# Patient Record
Sex: Female | Born: 1985 | Race: Black or African American | Hispanic: No | Marital: Single | Smoking: Current every day smoker
Health system: Southern US, Community
[De-identification: ages and names within clinical notes are randomized; demographics above are authoritative.]

## PROBLEM LIST (undated history)

## (undated) DIAGNOSIS — I10 Essential (primary) hypertension: Secondary | ICD-10-CM

## (undated) HISTORY — PX: CHOLECYSTECTOMY: SHX55

## (undated) HISTORY — PX: APPENDECTOMY: SHX54

## (undated) HISTORY — PX: OTHER SURGICAL HISTORY: SHX169

---
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
## 2006-12-29 ENCOUNTER — Emergency Department (HOSPITAL_COMMUNITY): Admission: EM | Admit: 2006-12-29 | Discharge: 2006-12-29 | Payer: Self-pay | Admitting: Family Medicine

## 2007-04-30 ENCOUNTER — Ambulatory Visit (HOSPITAL_COMMUNITY): Admission: RE | Admit: 2007-04-30 | Discharge: 2007-04-30 | Payer: Self-pay | Admitting: Family Medicine

## 2007-04-30 ENCOUNTER — Ambulatory Visit: Payer: Self-pay | Admitting: Family Medicine

## 2007-05-01 ENCOUNTER — Ambulatory Visit: Payer: Self-pay | Admitting: *Deleted

## 2007-06-03 ENCOUNTER — Emergency Department (HOSPITAL_COMMUNITY): Admission: EM | Admit: 2007-06-03 | Discharge: 2007-06-03 | Payer: Self-pay | Admitting: *Deleted

## 2007-06-22 ENCOUNTER — Ambulatory Visit: Payer: Self-pay | Admitting: Family Medicine

## 2007-07-21 ENCOUNTER — Inpatient Hospital Stay (HOSPITAL_COMMUNITY): Admission: AD | Admit: 2007-07-21 | Discharge: 2007-07-21 | Payer: Self-pay | Admitting: Family Medicine

## 2007-07-26 ENCOUNTER — Ambulatory Visit: Payer: Self-pay | Admitting: *Deleted

## 2007-08-08 ENCOUNTER — Ambulatory Visit (HOSPITAL_COMMUNITY): Admission: RE | Admit: 2007-08-08 | Discharge: 2007-08-08 | Payer: Self-pay | Admitting: Obstetrics & Gynecology

## 2007-08-13 ENCOUNTER — Ambulatory Visit (HOSPITAL_COMMUNITY): Admission: RE | Admit: 2007-08-13 | Discharge: 2007-08-13 | Payer: Self-pay | Admitting: Obstetrics & Gynecology

## 2007-08-20 ENCOUNTER — Ambulatory Visit: Payer: Self-pay | Admitting: Obstetrics & Gynecology

## 2007-09-04 ENCOUNTER — Ambulatory Visit (HOSPITAL_COMMUNITY): Admission: RE | Admit: 2007-09-04 | Discharge: 2007-09-04 | Payer: Self-pay | Admitting: Obstetrics & Gynecology

## 2007-09-10 ENCOUNTER — Ambulatory Visit: Payer: Self-pay | Admitting: Obstetrics & Gynecology

## 2007-09-17 ENCOUNTER — Ambulatory Visit (HOSPITAL_COMMUNITY): Admission: RE | Admit: 2007-09-17 | Discharge: 2007-09-17 | Payer: Self-pay | Admitting: Obstetrics & Gynecology

## 2007-10-01 ENCOUNTER — Ambulatory Visit: Payer: Self-pay | Admitting: Obstetrics & Gynecology

## 2007-10-29 ENCOUNTER — Ambulatory Visit: Payer: Self-pay | Admitting: Obstetrics & Gynecology

## 2007-10-29 ENCOUNTER — Ambulatory Visit (HOSPITAL_COMMUNITY): Admission: RE | Admit: 2007-10-29 | Discharge: 2007-10-29 | Payer: Self-pay | Admitting: Obstetrics & Gynecology

## 2007-11-19 ENCOUNTER — Ambulatory Visit: Payer: Self-pay | Admitting: Obstetrics & Gynecology

## 2007-11-26 ENCOUNTER — Ambulatory Visit (HOSPITAL_COMMUNITY): Admission: RE | Admit: 2007-11-26 | Discharge: 2007-11-26 | Payer: Self-pay | Admitting: Obstetrics & Gynecology

## 2007-11-29 ENCOUNTER — Ambulatory Visit: Payer: Self-pay | Admitting: Family Medicine

## 2007-12-13 ENCOUNTER — Telehealth (INDEPENDENT_AMBULATORY_CARE_PROVIDER_SITE_OTHER): Payer: Self-pay | Admitting: *Deleted

## 2007-12-13 ENCOUNTER — Ambulatory Visit: Payer: Self-pay | Admitting: Family Medicine

## 2007-12-15 ENCOUNTER — Inpatient Hospital Stay (HOSPITAL_COMMUNITY): Admission: AD | Admit: 2007-12-15 | Discharge: 2007-12-15 | Payer: Self-pay | Admitting: Family Medicine

## 2007-12-15 ENCOUNTER — Ambulatory Visit: Payer: Self-pay | Admitting: Advanced Practice Midwife

## 2007-12-24 ENCOUNTER — Ambulatory Visit (HOSPITAL_COMMUNITY): Admission: RE | Admit: 2007-12-24 | Discharge: 2007-12-24 | Payer: Self-pay | Admitting: Obstetrics & Gynecology

## 2008-01-03 ENCOUNTER — Ambulatory Visit: Payer: Self-pay | Admitting: Family Medicine

## 2008-01-17 ENCOUNTER — Ambulatory Visit: Payer: Self-pay | Admitting: Family Medicine

## 2008-01-17 ENCOUNTER — Other Ambulatory Visit: Payer: Self-pay | Admitting: Family Medicine

## 2008-01-17 ENCOUNTER — Ambulatory Visit (HOSPITAL_COMMUNITY): Admission: RE | Admit: 2008-01-17 | Discharge: 2008-01-17 | Payer: Self-pay | Admitting: Obstetrics & Gynecology

## 2008-01-31 ENCOUNTER — Ambulatory Visit: Payer: Self-pay | Admitting: Obstetrics & Gynecology

## 2008-02-07 ENCOUNTER — Ambulatory Visit: Payer: Self-pay | Admitting: Obstetrics & Gynecology

## 2008-02-07 ENCOUNTER — Ambulatory Visit (HOSPITAL_COMMUNITY): Admission: RE | Admit: 2008-02-07 | Discharge: 2008-02-07 | Payer: Self-pay | Admitting: Obstetrics & Gynecology

## 2008-02-13 ENCOUNTER — Inpatient Hospital Stay (HOSPITAL_COMMUNITY): Admission: AD | Admit: 2008-02-13 | Discharge: 2008-02-15 | Payer: Self-pay | Admitting: Obstetrics & Gynecology

## 2008-02-13 ENCOUNTER — Ambulatory Visit: Payer: Self-pay | Admitting: Obstetrics and Gynecology

## 2008-03-16 ENCOUNTER — Emergency Department (HOSPITAL_COMMUNITY): Admission: EM | Admit: 2008-03-16 | Discharge: 2008-03-16 | Payer: Self-pay | Admitting: Emergency Medicine

## 2008-03-18 ENCOUNTER — Inpatient Hospital Stay (HOSPITAL_COMMUNITY): Admission: EM | Admit: 2008-03-18 | Discharge: 2008-03-19 | Payer: Self-pay | Admitting: Emergency Medicine

## 2008-03-18 ENCOUNTER — Encounter (INDEPENDENT_AMBULATORY_CARE_PROVIDER_SITE_OTHER): Payer: Self-pay | Admitting: General Surgery

## 2008-04-12 IMAGING — US US OB FOLLOW-UP
1 series · 14 of 28 positions shown · non-contrast
Comparison: none

OBSTETRICAL ULTRASOUND:
 This ultrasound was performed in The [HOSPITAL], and the [facility] report will be stored to [REDACTED] PACS.

[Series 1: us ob follow-up · 14 of 72 slices shown]
[im 3/72]
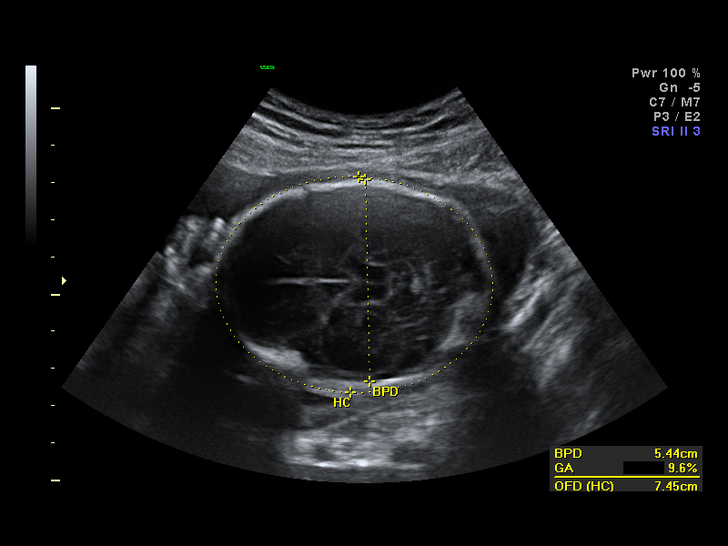
[im 8/72]
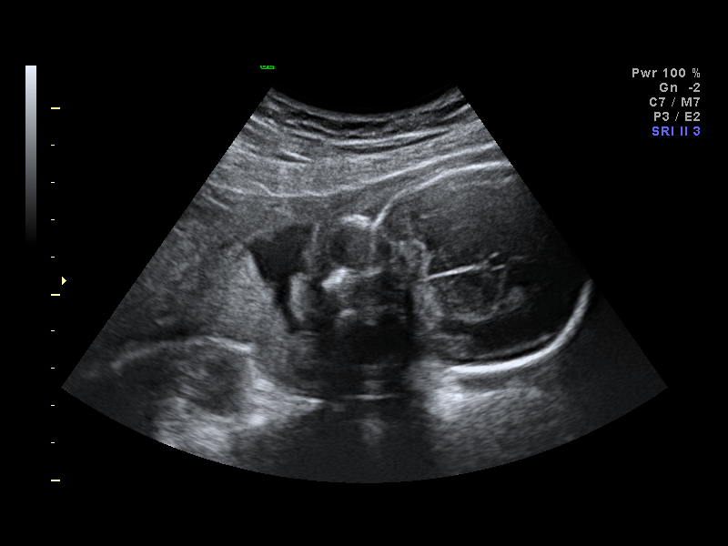
[im 14/72]
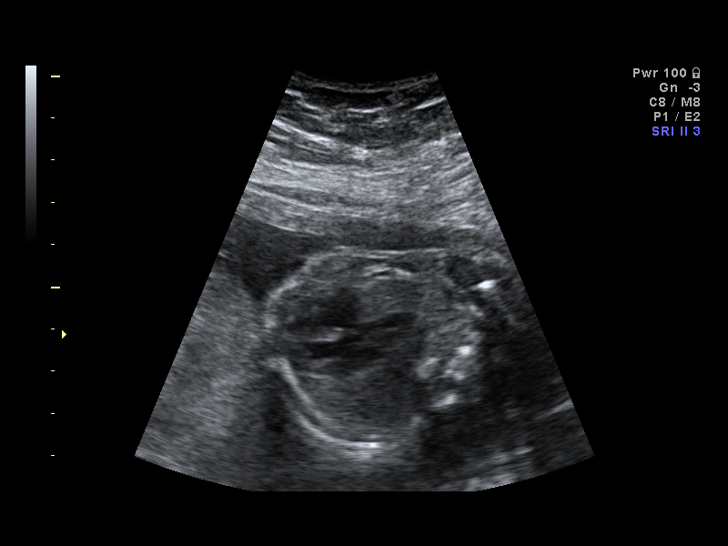
[im 19/72]
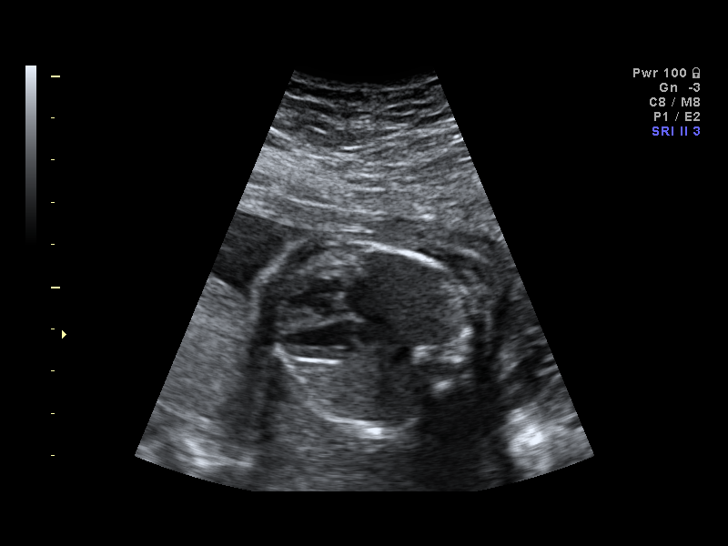
[im 24/72]
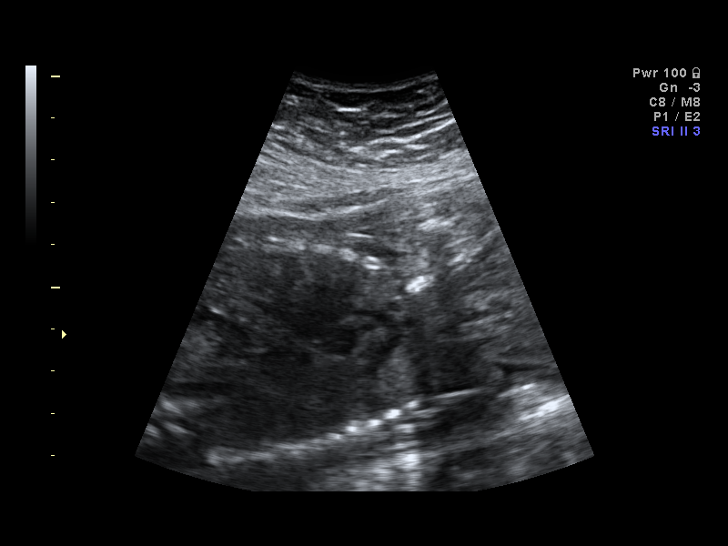
[im 29/72]
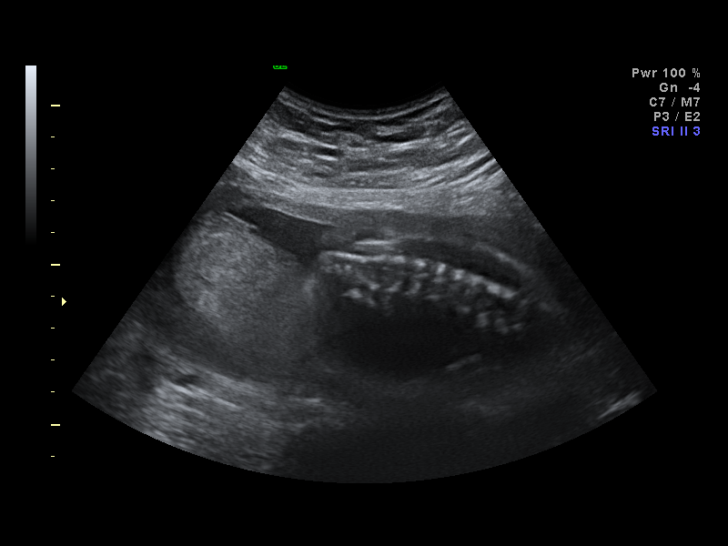
[im 35/72]
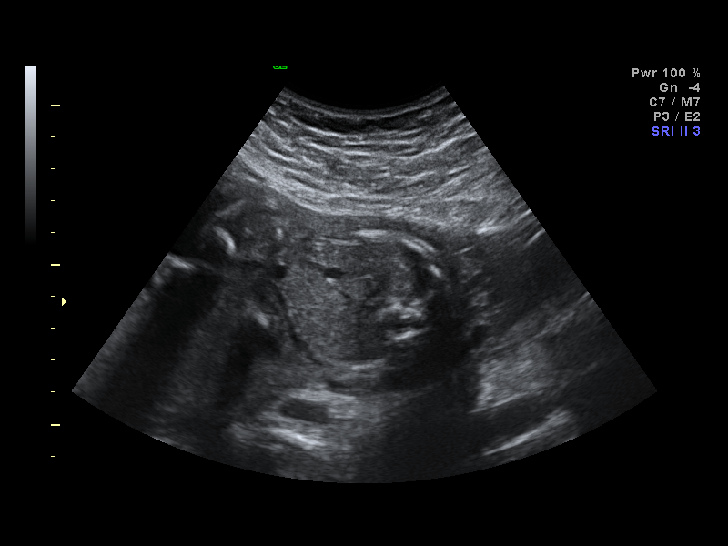
[im 40/72]
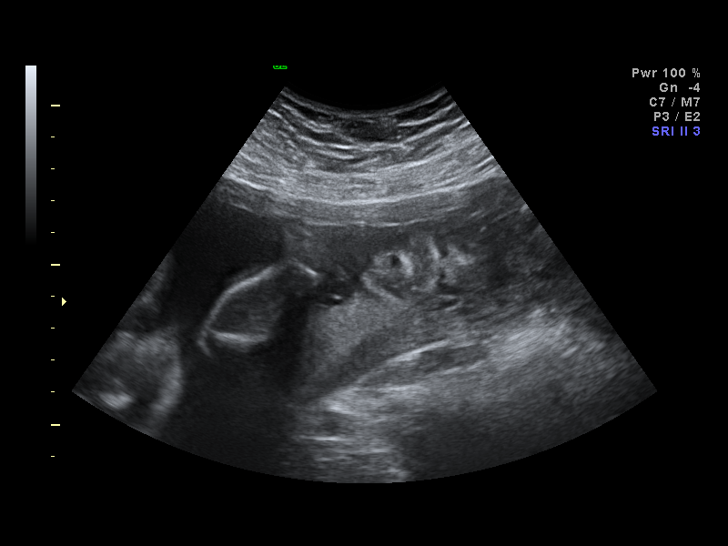
[im 45/72]
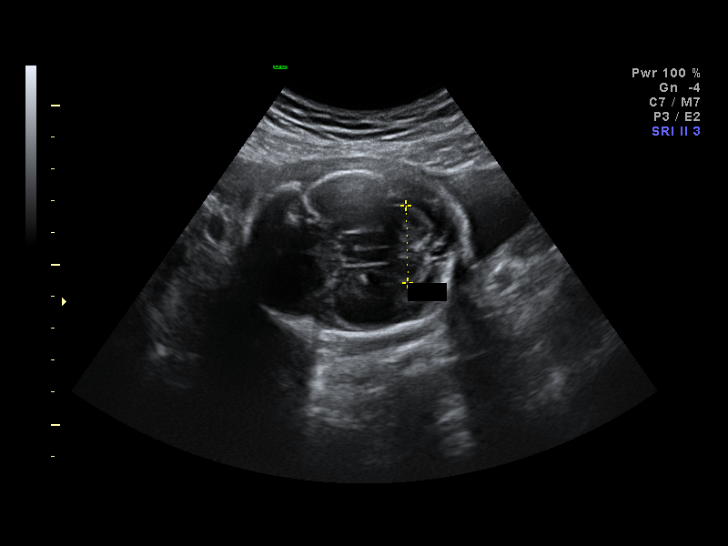
[im 50/72]
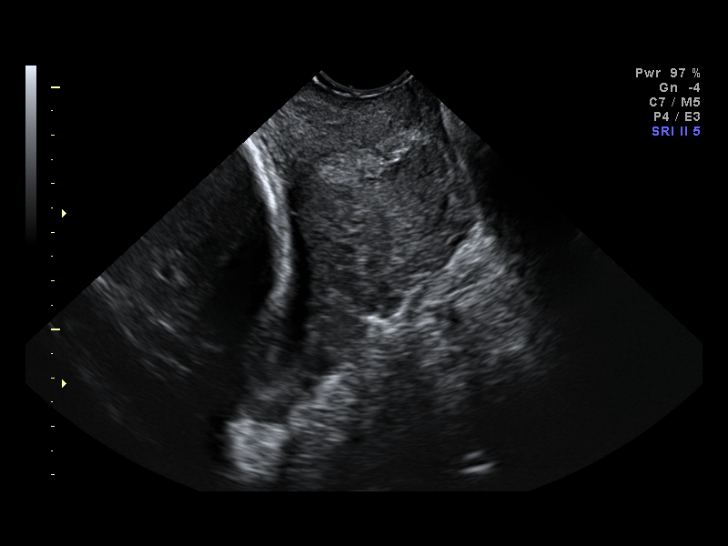
[im 56/72]
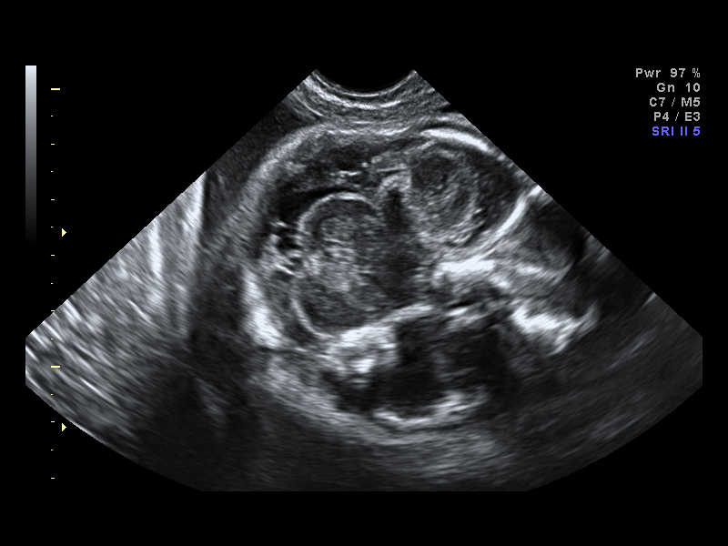
[im 61/72]
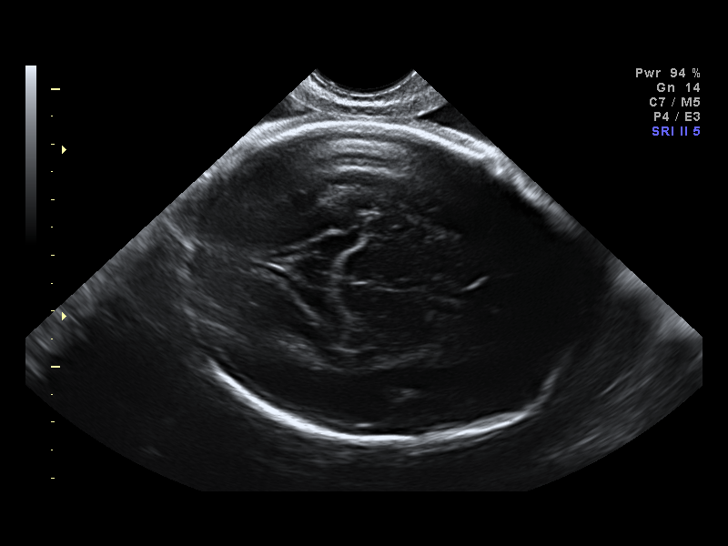
[im 66/72]
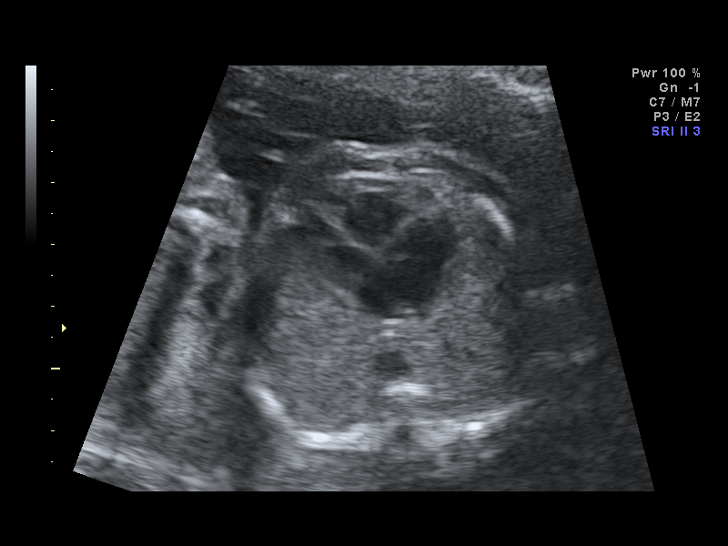
[im 72/72]
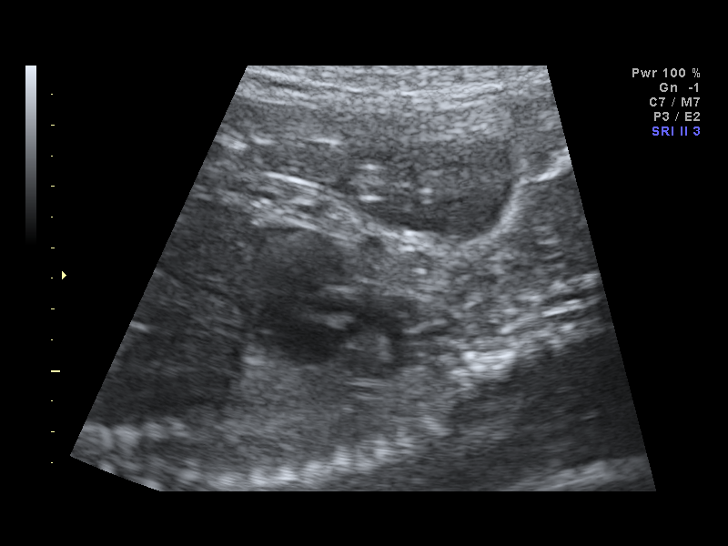

[14 of 28 positions shown; findings below may reference images not displayed]

IMPRESSION: The [facility] report has also been faxed to the ordering physician.

## 2008-04-14 ENCOUNTER — Emergency Department (HOSPITAL_COMMUNITY): Admission: EM | Admit: 2008-04-14 | Discharge: 2008-04-14 | Payer: Self-pay | Admitting: Emergency Medicine

## 2008-08-29 IMAGING — US US ABDOMEN COMPLETE
1 series · 14 of 25 positions shown · non-contrast
Comparison: None

CLINICAL DATA: Abdominal pain with nausea vomiting.

ABDOMEN ULTRASOUND
TECHNIQUE: Complete abdominal ultrasound examination was performed
including evaluation of the liver, gallbladder, bile ducts,
pancreas, kidneys, spleen, IVC, and abdominal aorta.

[Series 1: unknown · 0.38mm/px · 14 of 64 slices shown]
[im 1/64]
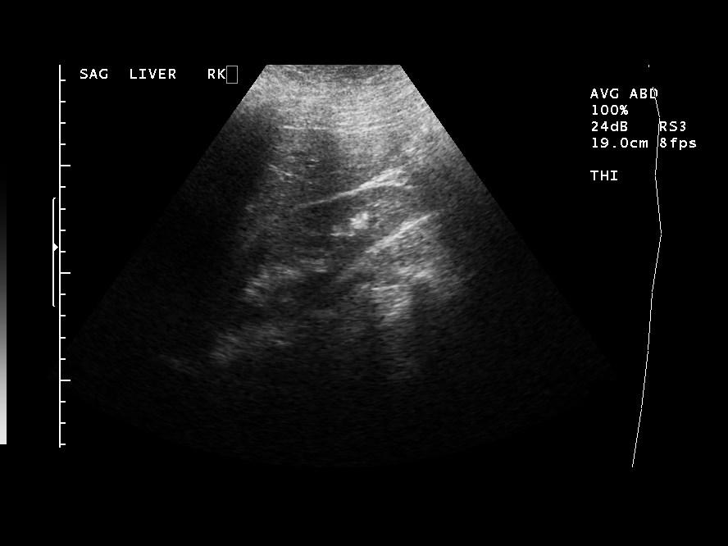
[im 6/64]
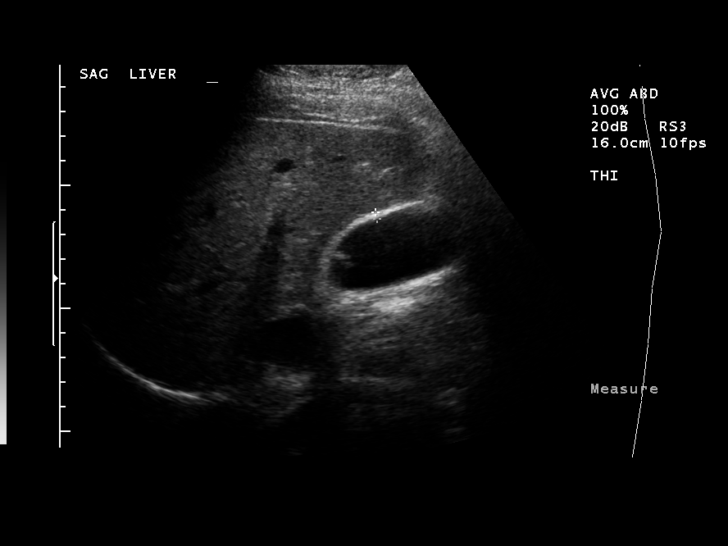
[im 11/64]
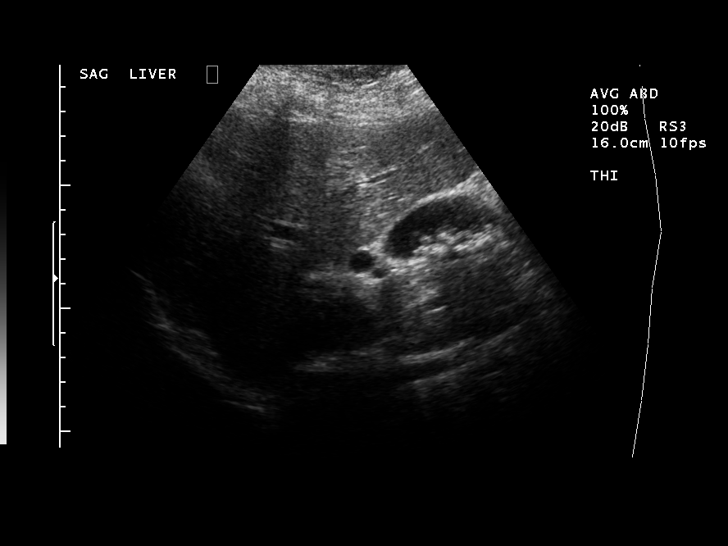
[im 16/64]
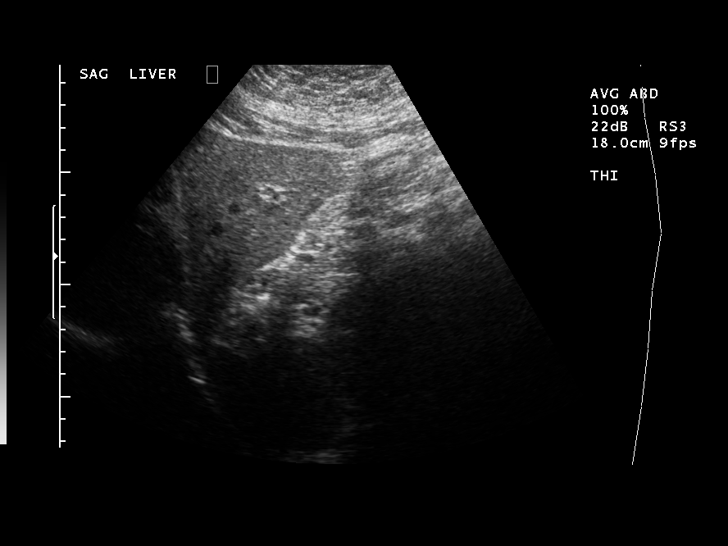
[im 22/64]
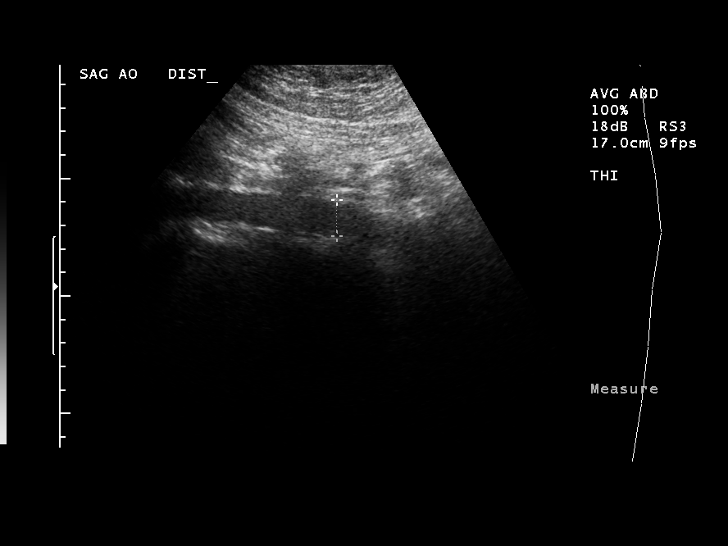
[im 24/64]
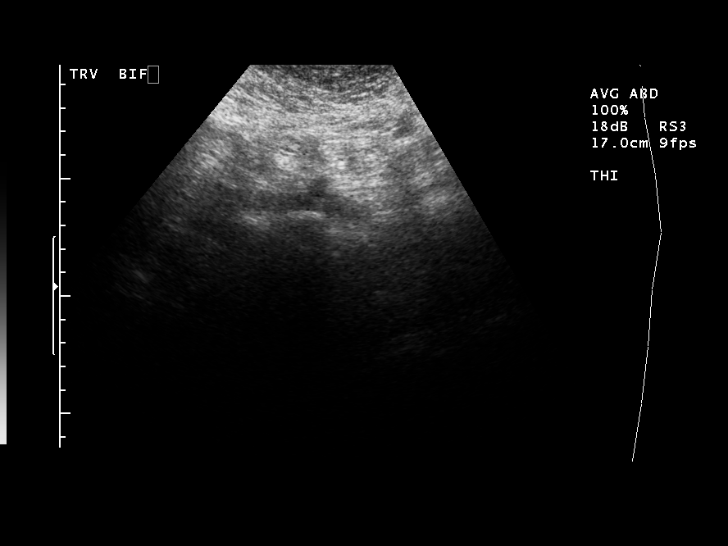
[im 29/64]
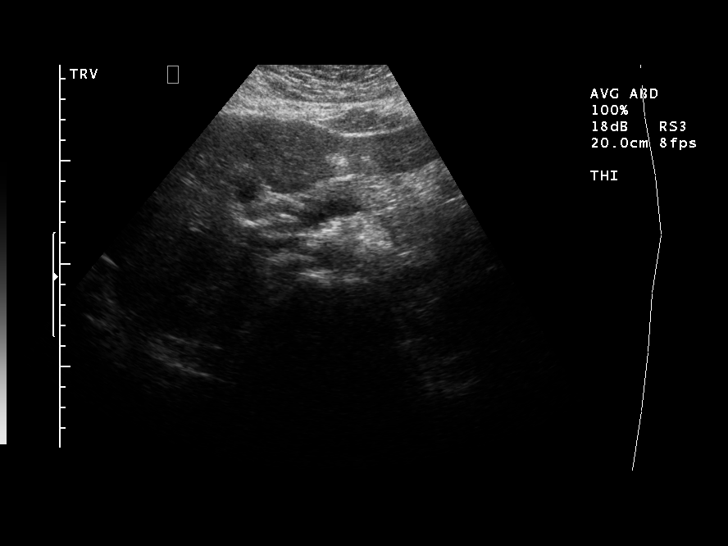
[im 35/64]
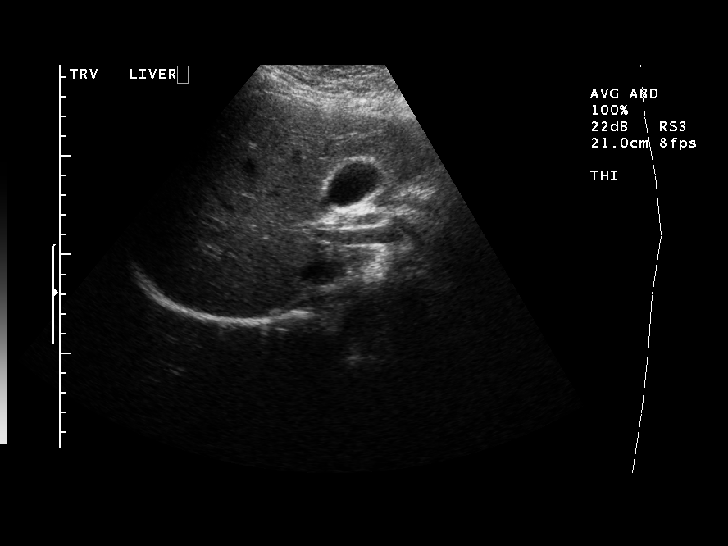
[im 40/64]
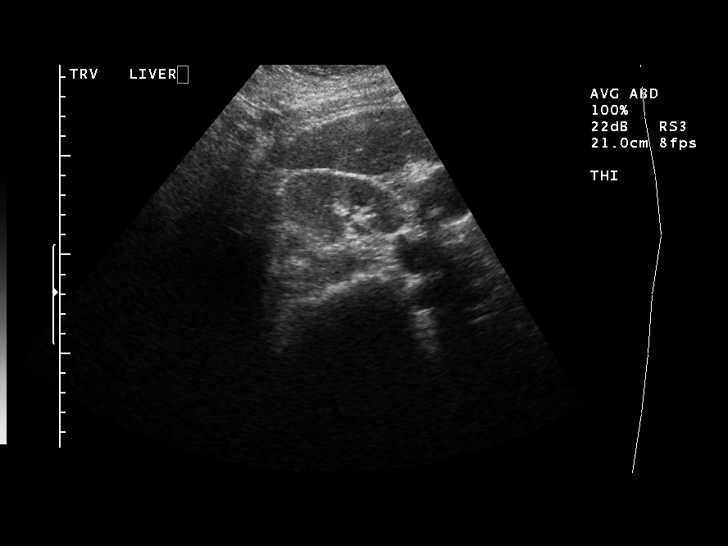
[im 43/64]
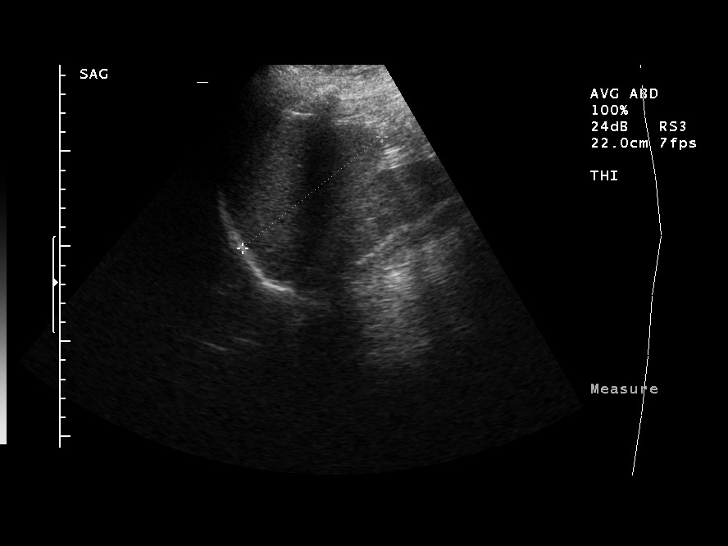
[im 48/64]
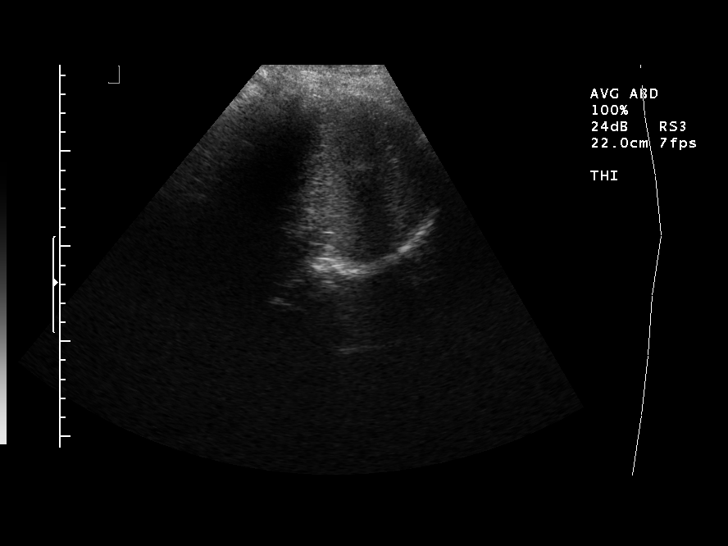
[im 53/64]
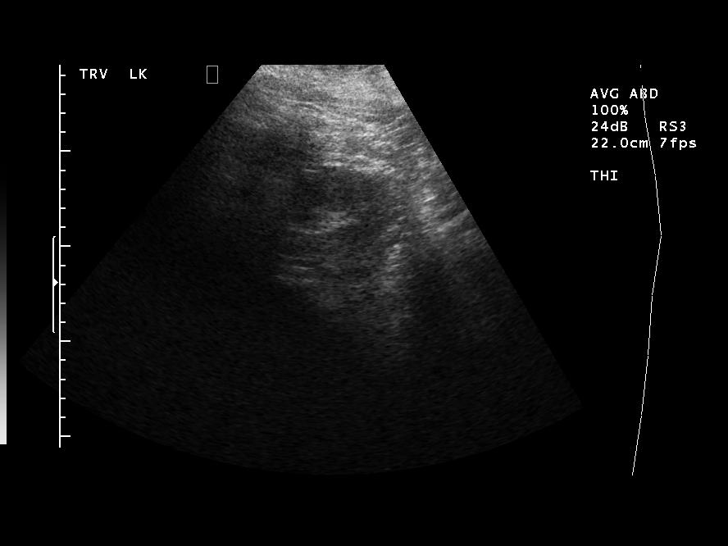
[im 58/64]
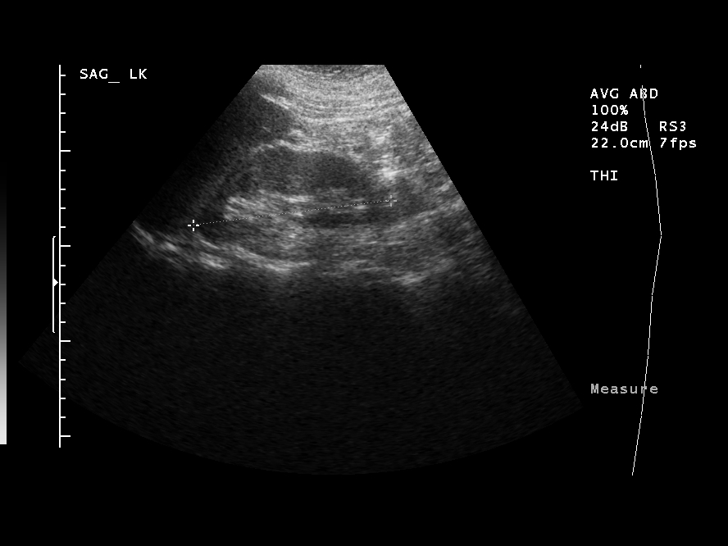
[im 64/64]
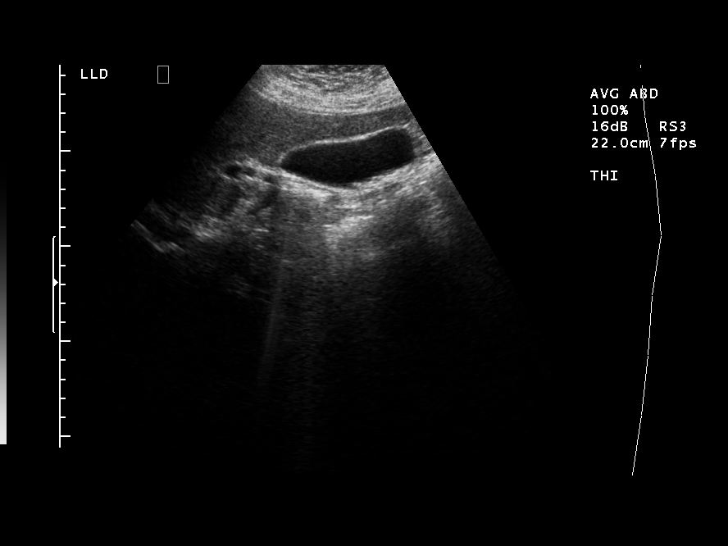

[14 of 25 positions shown; findings below may reference images not displayed]

FINDINGS: Multiple small mobile gallstones are noted.  No evidence
of gallbladder wall thickening, sonographic Murphy's sign, or
pericholecystic fluid.
There is no evidence of biliary dilatation and the CBD measures 5
mm in greatest diameter.

The liver, IVC, pancreas, spleen, kidneys, and abdominal aorta are
unremarkable.
No evidence of free fluid.
IMPRESSION: Cholelithiasis without evidence of acute cholecystitis.

No evidence of biliary dilatation.

## 2008-08-31 IMAGING — RF DG CHOLANGIOGRAM OPERATIVE
1 series · 10 of 10 positions shown · non-contrast
Comparison: None available

CLINICAL DATA: Cholelithiasis

INTRAOPERATIVE CHOLANGIOGRAM
TECHNIQUE: Multiple fluoroscopic spot radiographs were obtained
during intraoperative cholangiogram and are submitted for
interpretation post-operatively.

[Series 1: run · 4 acquisitions, 10 frames shown]
[im 1/4]
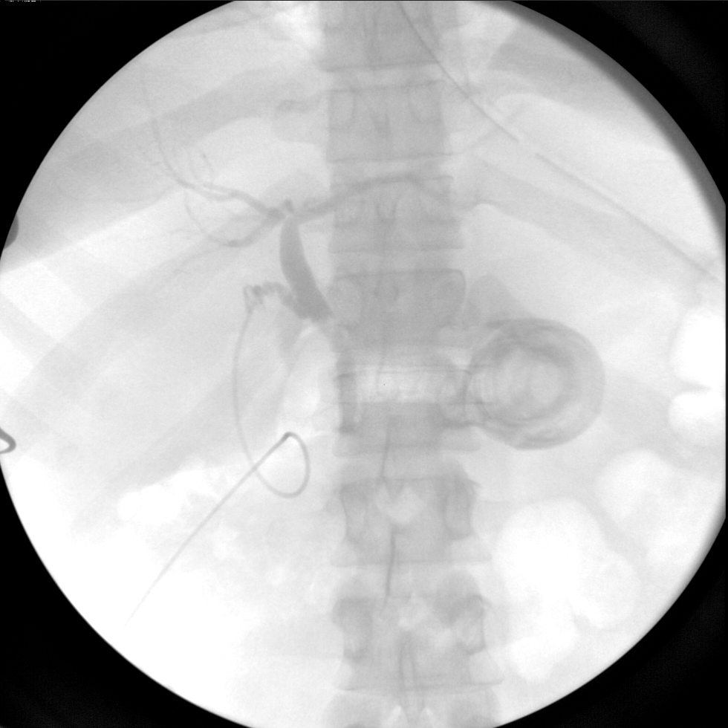
[im 1/4]
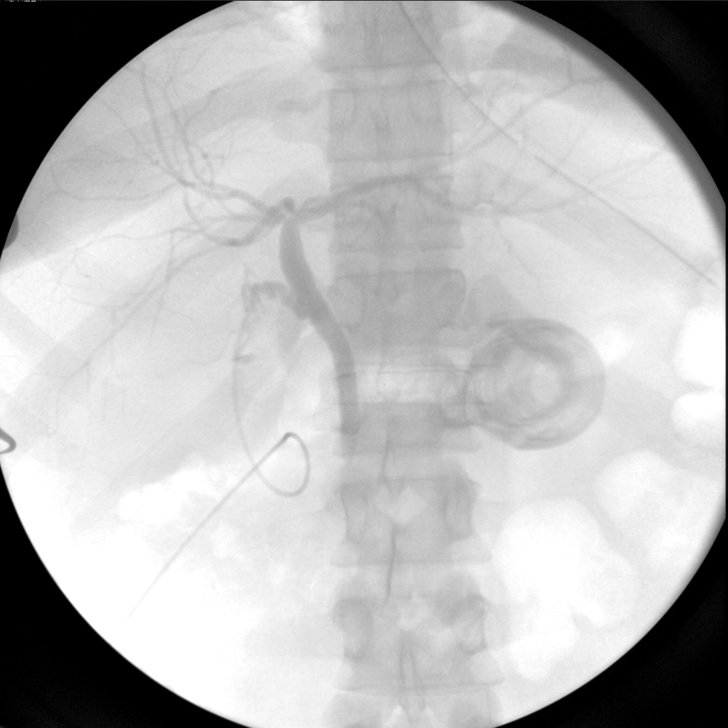
[im 1/4]
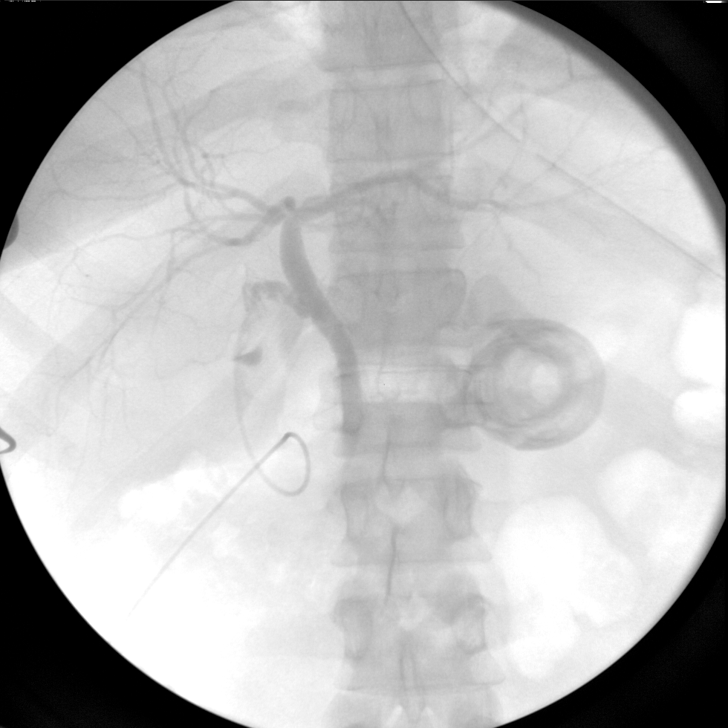
[im 1/4]
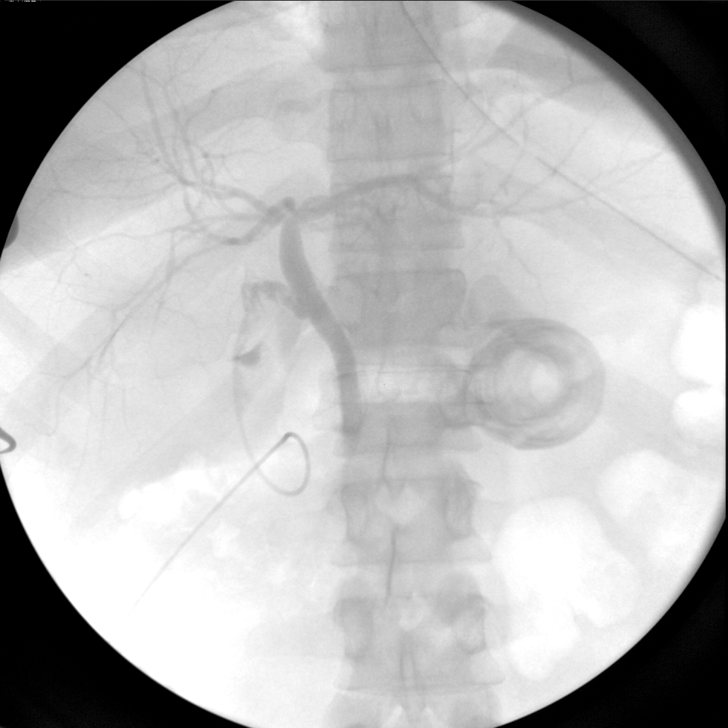
[im 2/4]
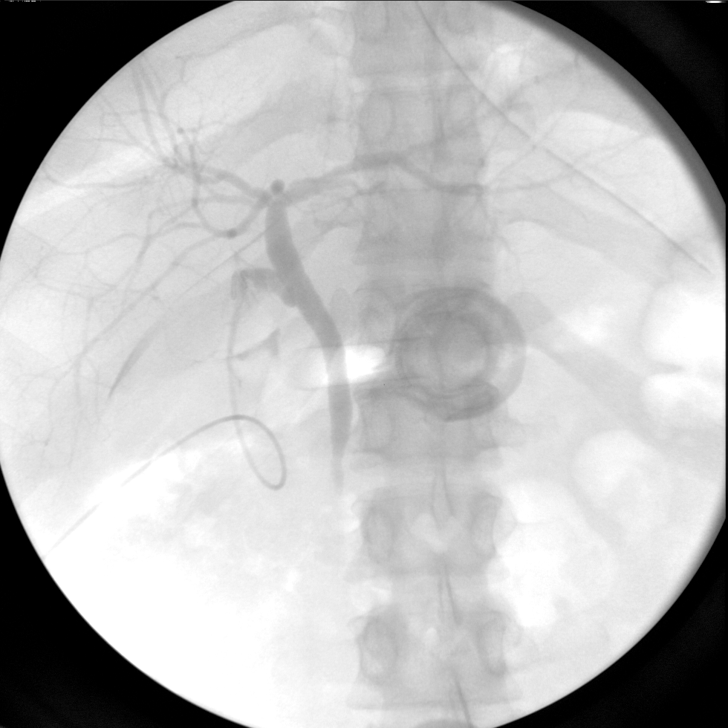
[im 2/4]
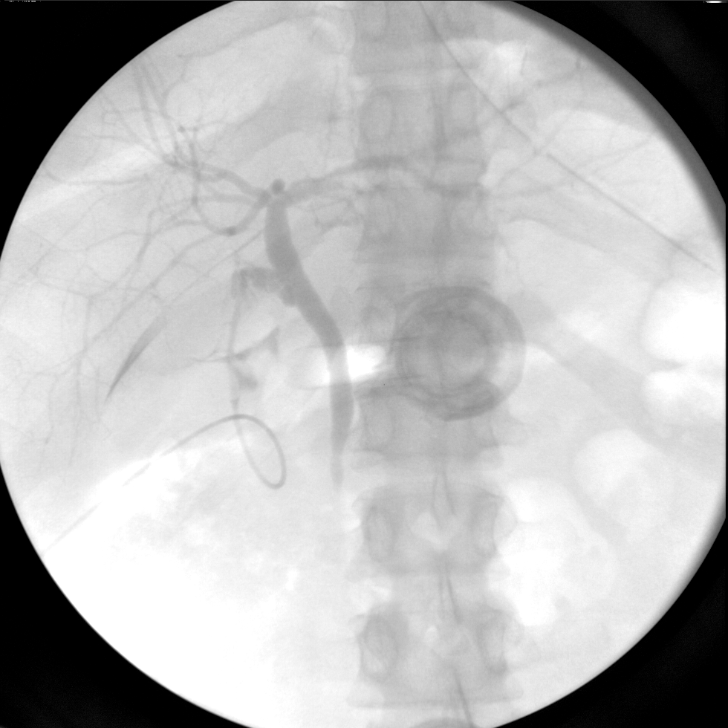
[im 2/4]
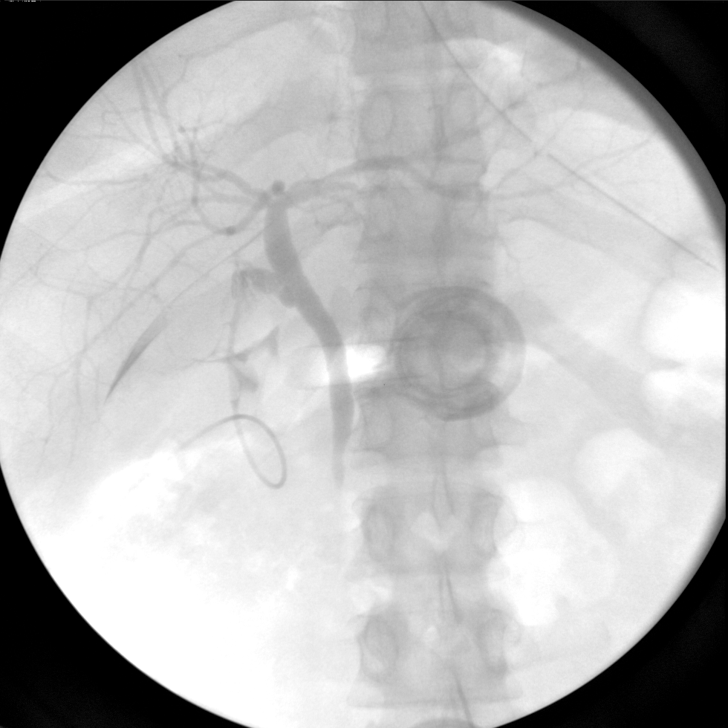
[im 2/4]
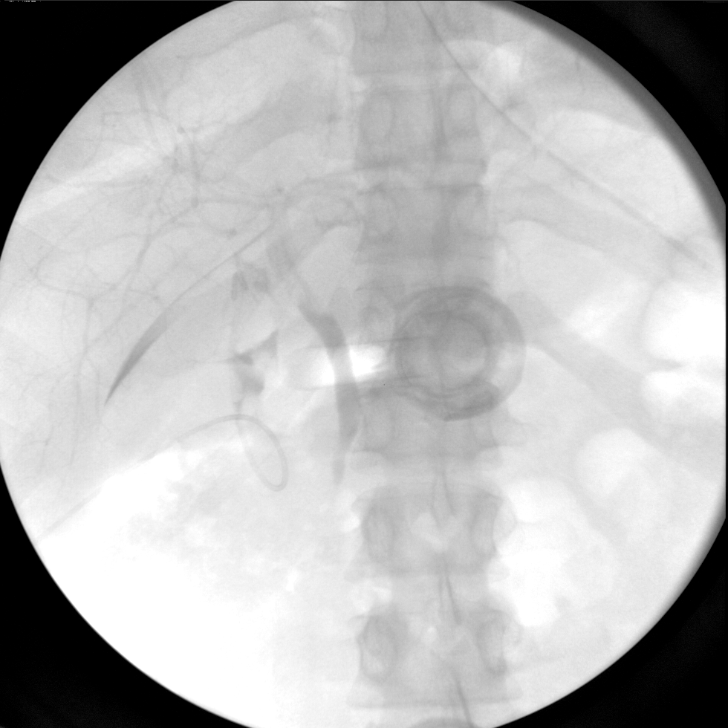
[im 3/4]
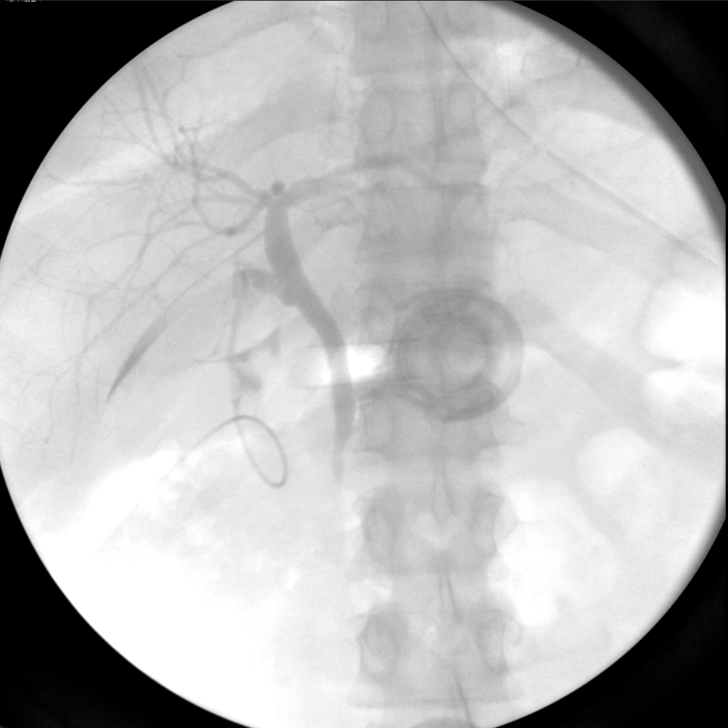
[im 4/4]
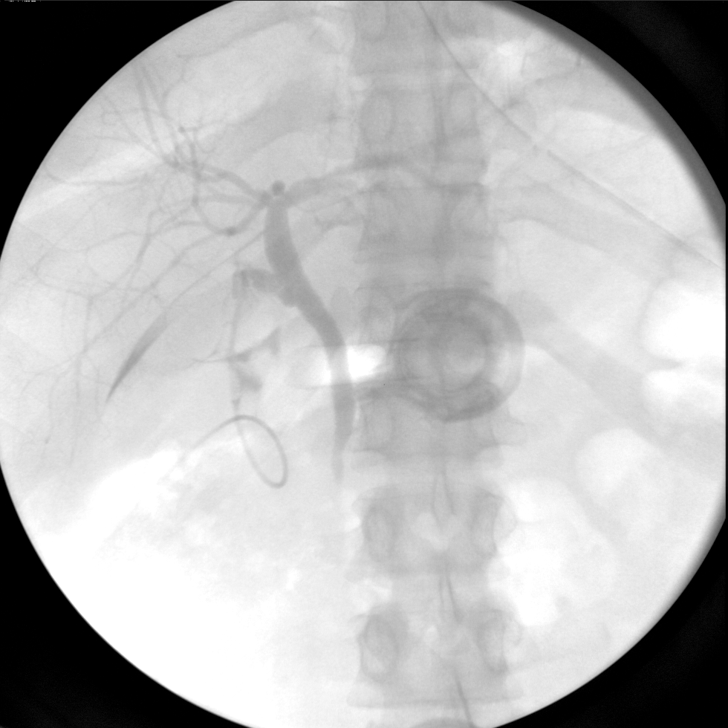

[10 of 10 positions shown; findings below may reference images not displayed]

FINDINGS: No persistent filling defects in the common duct.
Intrahepatic ducts are incompletely visualized, appearing
decompressed centrally. Contrast passes into the duodenum.

IMPRESSION

Negative for retained common duct stone.

## 2008-11-24 ENCOUNTER — Emergency Department (HOSPITAL_COMMUNITY): Admission: EM | Admit: 2008-11-24 | Discharge: 2008-11-24 | Payer: Self-pay | Admitting: Emergency Medicine

## 2010-12-05 ENCOUNTER — Encounter: Payer: Self-pay | Admitting: *Deleted

## 2011-03-29 NOTE — Op Note (Signed)
NAMEBROOKELIN, FELBER           ACCOUNT NO.:  [id number]   MEDICAL RECORD NO.:  [id number]          PATIENT TYPE:  WOC   LOCATION:  WOC                          FACILITY:  [facility]   PHYSICIAN:  Adolph Pollack, M.D.DATE OF BIRTH:  [birth year]   DATE OF PROCEDURE:  03/18/2008  DATE OF DISCHARGE:                               OPERATIVE REPORT   PREOPERATIVE DIAGNOSIS:  Symptomatic cholelithiasis with intractable  symptoms.   POSTOPERATIVE DIAGNOSIS:  Symptomatic cholelithiasis with intractable  symptoms.   PROCEDURE:  Laparoscopic cholecystectomy with intraoperative  cholangiogram.   SURGEON:  Adolph Pollack, MD   ASSISTANT:  Currie Paris MD   ANESTHESIA:  General.   INDICATION:  This is a 25 year old female who has been having  intermittent upper abdominal pain postprandially from nausea and  vomiting but has had 2 days of persistent symptoms and presented to the  emergency department.  She was admitted and had some mild elevation of  liver function tests which have improved today.  She now brought to the  operating room for the above procedure.   TECHNIQUE:  She is seen in the holding area and brought to the operating  room, placed supine on the operating table and a general anesthetic was  administered.  The abdominal wall was sterilely prepped and draped.  Marcaine was infiltrated in the subumbilical region.  A subumbilical  incision was made through the skin, subcutaneous tissue, anterior  posterior fascia and peritoneum entering the peritoneal cavity under  direct vision.  A pursestring suture of 0 Vicryl was placed around the  fascial edges.  A Hasson trocar was introduced into the peritoneal  cavity and a pneumoperitoneum was created by insufflation of CO2 gas.   Next, a laparoscope was introduced.  She was placed in a reverse  Trendelenburg position with the right side tilted slightly up.  A 10-mm  trocar was placed in epigastric incision and two 5-mm  trocar placed in  the right mid lateral abdomen.  The fundus of the gallbladder was  grasped.  No acute inflammatory changes were noted.  There were  adhesions between the duodenum and the body and the infundibulum of the  gallbladder and these were carefully taken down bluntly.  There was some  edema down by the infundibulum.  The infundibulum was then retracted  laterally and mobilized with blunt dissection.  I then isolated the  cystic duct and created a window around it.  I also isolated the  anterior branch of the cystic artery and created a window around it.  A  clip was placed at the cystic duct gallbladder junction and a small  incision made in the cystic duct.  I milked back a fair amount of sludge  from this area.   Following this, a Cholangiocath was passed through the anterior  abdominal wall and placed into the cystic duct and a cholangiogram was  performed.   Under real-time fluoroscopy, dilute contrast was injected into the  cystic duct which was of moderate length.  The common hepatic, right and  left hepatic, and the common bile ducts were all filled.  I  could see  the common bile duct tapering down but did not get any filling of the  duodenum with two separate injections.  I could not see an obvious  meniscus or large stone defect and suspected that the there may be some  sludge at the ampulla.   I then removed the cholangiocatheter and clipped the cystic duct three  times and staying inside and divided it.  I did give her 1 mg of  glucagon.  The anterior branch of the cystic artery was clipped and  divided.  Next, I noted a posterior branch and this was clipped and  divided.  The gallbladder was dissected free from the liver using  electrocautery and placed in an Endopouch bag.   The Endopouch bag was removed through the subumbilical port.  The  subumbilical fascial defect closed under laparoscopic vision by  tightening up and tying down with pursestring suture.   The gallbladder  fossa was irrigated.  No bleeding was noted, no bile leak was noted.  Irrigation of fluid was evacuated.   The trocars were removed and pneumoperitoneum was released.  Skin  incisions were closed with 4-0 Monocryl subcuticular stitches followed  by Steri-Strips and sterile dressings.  She tolerated the procedure well  without any apparent complications and was taken to the recovery room in  satisfactory condition.  Will observe her overnight and recheck her  LFTs.      Adolph Pollack, M.D.  Electronically Signed     TJR/MEDQ  D:  03/18/2008  T:  03/19/2008  Job:  [id number]

## 2011-03-29 NOTE — Discharge Summary (Signed)
Teresa Hobbs, Teresa Hobbs           ACCOUNT NO.:  [id number]   MEDICAL RECORD NO.:  [id number]          PATIENT TYPE:  WOC   LOCATION:  WOC                          FACILITY:  [facility]   PHYSICIAN:  Adolph Pollack, M.D.DATE OF BIRTH:  November 11, 1986   DATE OF ADMISSION:  03/18/2008  DATE OF DISCHARGE:  03/19/2008                               DISCHARGE SUMMARY   PRINCIPAL DISCHARGE DIAGNOSIS:  Symptomatic cholelithiasis.   SECONDARY DIAGNOSIS:  None.   PROCEDURE:  Laparoscopic cholecystectomy on Mar 18, 2008.   REASON FOR ADMISSION:  This 25 year old female is recently postpartum  and she has had intermittent postprandial pain, but for 48 hours prior  to admission she had persistent pain, nausea, and vomiting that was  intractable.  She presented to the emergency department where she is  known to have some mild elevation of some of her liver function tests,  normal white cell count.  Ultrasound demonstrated gallstones and normal  common bile duct.  She subsequently was admitted.   HOSPITAL COURSE:  She was admitted and taken to the operating room on  Mar 18, 2008, where she underwent a laparoscopic cholecystectomy  cholangiogram.  Intraoperative cholangiogram did not show any common  bile duct stones.  By her first day she was feeling much better than she  did preoperatively, was tolerating liquids and wanted to go home and was  able to be discharged.   DISPOSITION:  Discharged to home in satisfactory condition on Mar 19, 2008.  She was given discharge instructions and narcotic pain medicine.  She will follow up in the office 2-3 weeks for postop visit.  Instructions were given to her.      Adolph Pollack, M.D.  Electronically Signed     TJR/MEDQ  D:  05/07/2008  T:  05/08/2008  Job:  [id number]

## 2011-03-29 NOTE — H&P (Signed)
NAMEBEAUTIFULL, CISAR           ACCOUNT NO.:  [id number]   MEDICAL RECORD NO.:  [id number]          PATIENT TYPE:  INP   LOCATION:  5125                         FACILITY:  [facility]   PHYSICIAN:  Sharlet Salina T. Hoxworth, M.D.DATE OF BIRTH:  1986/10/16   DATE OF ADMISSION:  03/17/2008  DATE OF DISCHARGE:                              HISTORY & PHYSICAL   CHIEF COMPLAINT:  Abdominal pain, nausea, and vomiting.   HISTORY OF PRESENT ILLNESS:  Teresa Hobbs is a 25 year old African  American female who is recently postpartum as of early April 2009.  Since about February 2009, she has been having intermittent abdominal  pain.  She describes pressure and sharp pain in her epigastrium  radiating directly through to her back.  This is brought on by meals and  is alleviated by not eating.  Initially, the pain was fairly short-lived  and not so severe and she did not seek medical attention.  However,  about 48 hours ago, she developed more severe epigastric pain radiating  to her back with persistent nausea, vomiting, and presented to [facility]  Emergency Room.  She was evaluated at that time and ultrasound showed  gallstones without evidence of cholecystitis.  Lab work was  unremarkable.  She was treated symptomatically and discharged for  outpatient followup.  Over the last 48 hours, however, she has had  persistent nausea and vomiting whenever she tries to eat or drink  anything, and has had persistent waxing and waning epigastric pain  radiating to the back.  She represented to the emergency room this  evening.  She denies fever, chills, or jaundice.  She has been a little  constipated.  No melena or hematochezia.  No urinary symptoms.   PAST MEDICAL HISTORY:  Previous surgery includes appendectomy and knee  surgery.  Medically, without serious illness or hospitalizations.   MEDICATIONS:  OxyIR and Reglan prescribed 2 days ago from the emergency  room.   ALLERGIES:  PENICILLIN which  causes hives.   SOCIAL HISTORY:  He is not employed.  Smokes about half-pack cigarettes  per day.  Does not drink alcohol.   FAMILY HISTORY:  Noncontributory.   REVIEW OF SYSTEMS:  No fever, chills, or weight change.  HEENT:  No  vision, hearing, or swallowing problems.  RESPIRATORY:  No shortness of  breath, cough, or wheezing.  CARDIAC:  No chest pain, palpitations, or  history of heart disease.  ABDOMEN:  GI as above.  GU: Negative as  above.   PHYSICAL EXAMINATION:  VITAL SIGNS:  Temperature is 99.3, pulse 87,  respirations 20, and blood pressure 140/90.  GENERAL:  Mildly obese young [place] American female in no acute stress.  SKIN:  Warm and dry.  No rash infection.  HEENT:  No palpable mass or thyromegaly.  Sclerae nonicteric.  Oropharynx clear.  LYMPH NODES:  There is no cervical, supraclavicular, or inguinal nodes  palpable.  RESPIRATORY:  Clear without wheezing or increase work of breathing.  CARDIAC:  Regular rate and rhythm.  No murmurs.  No JVD.  No edema.  Peripheral pulses intact.  ABDOMEN:  Well-healed right lower quadrant incision,  mildly obese, and  soft.  There is epigastric and some right upper quadrant tenderness, but  no guarding.  No masses.  No organomegaly.  EXTREMITIES:  No joint swelling deformity.  NEUROLOGIC:  Alert and fully oriented.  Motor sensory exams grossly  normal.   LABORATORY:  White count 6.2, hemoglobin 11.1, and lipase 13.  LFTs  mildly elevated.  SGOT and PT 44 and 66 respectively.  Alkaline  phosphatase 125, bilirubin 0.6.  Electrolytes, BUN, and creatinine  normal.  Ultrasound gallbladder on Mar 15, 2008, shows cholelithiasis.  No gallbladder wall thickening or bile duct dilatation.   ASSESSMENT/PLAN:  Persistent abdominal pain, nausea, and vomiting,  consistent with persistent biliary colic or early acute cholecystitis.  She is not getting by at home, on symptomatic treatment.  She will be  admitted, made n.p.o., treated with pain  and nausea medication, we will  plan urgent laparoscopic cholecystectomy with cholangiogram.      Sharlet Salina T. Hoxworth, M.D.  Electronically Signed     BTH/MEDQ  D:  03/18/2008  T:  03/18/2008  Job:  [id number]

## 2011-08-04 LAB — URINALYSIS, ROUTINE W REFLEX MICROSCOPIC
Bilirubin Urine: NEGATIVE
Nitrite: NEGATIVE

## 2011-08-04 LAB — POCT URINALYSIS DIP (DEVICE)
Bilirubin Urine: NEGATIVE
Bilirubin Urine: NEGATIVE
Glucose, UA: NEGATIVE
Hgb urine dipstick: NEGATIVE
Hgb urine dipstick: NEGATIVE
Ketones, ur: NEGATIVE
Nitrite: NEGATIVE
Operator id: 194561
Operator id: 148111
Operator id: 159681
Protein, ur: 30 — AB
Protein, ur: NEGATIVE
Specific Gravity, Urine: 1.015
Urobilinogen, UA: 0.2
Urobilinogen, UA: 1
pH: 7
pH: 7

## 2011-08-04 LAB — URINE MICROSCOPIC-ADD ON

## 2011-08-04 LAB — FETAL FIBRONECTIN: Fetal Fibronectin: NEGATIVE

## 2011-08-05 LAB — POCT URINALYSIS DIP (DEVICE)
Hgb urine dipstick: NEGATIVE
Nitrite: NEGATIVE
Operator id: 297281
Specific Gravity, Urine: 1.015
pH: 7

## 2011-08-08 LAB — POCT URINALYSIS DIP (DEVICE)
Bilirubin Urine: NEGATIVE
Glucose, UA: NEGATIVE
Glucose, UA: NEGATIVE
Hgb urine dipstick: NEGATIVE
Hgb urine dipstick: NEGATIVE
Ketones, ur: NEGATIVE
Ketones, ur: NEGATIVE
Operator id: 297281
Specific Gravity, Urine: 1.01
Specific Gravity, Urine: <=1.005
Urobilinogen, UA: 0.2
Urobilinogen, UA: 0.2
pH: 7.5

## 2011-08-09 LAB — CBC
HCT: 28.4 — ABNORMAL LOW
MCV: 81.4
MCV: 81.8
Platelets: 196
Platelets: 240
RBC: 3.47 — ABNORMAL LOW
RBC: 4.5
WBC: 14.6 — ABNORMAL HIGH
WBC: 15.9 — ABNORMAL HIGH

## 2011-08-09 LAB — RPR: RPR Ser Ql: NONREACTIVE

## 2011-08-09 LAB — DIC (DISSEMINATED INTRAVASCULAR COAGULATION)PANEL
Fibrinogen: 394
Smear Review: NONE SEEN
aPTT: 29

## 2011-08-09 LAB — COMPREHENSIVE METABOLIC PANEL
AST: 24
Albumin: 2.7 — ABNORMAL LOW
BUN: 1 — ABNORMAL LOW
CO2: 23
Calcium: 8.7
GFR calc non Af Amer: >60
Sodium: 137
Total Bilirubin: 0.5
Total Protein: 6.3

## 2011-08-09 LAB — URINALYSIS, DIPSTICK ONLY
Glucose, UA: NEGATIVE
Protein, ur: NEGATIVE

## 2011-08-19 LAB — POCT URINALYSIS DIP (DEVICE)
Glucose, UA: NEGATIVE
Ketones, ur: 15 — AB
Operator id: 194561

## 2011-08-23 LAB — POCT URINALYSIS DIP (DEVICE)
Bilirubin Urine: NEGATIVE
Ketones, ur: NEGATIVE
Operator id: 120861

## 2011-08-24 LAB — POCT URINALYSIS DIP (DEVICE)
Nitrite: NEGATIVE
Operator id: 297281
Protein, ur: NEGATIVE

## 2011-08-25 LAB — POCT URINALYSIS DIP (DEVICE)
Ketones, ur: NEGATIVE
Nitrite: NEGATIVE
Operator id: 194561
Protein, ur: NEGATIVE

## 2011-08-26 LAB — URINALYSIS, ROUTINE W REFLEX MICROSCOPIC
Bilirubin Urine: NEGATIVE
Hgb urine dipstick: NEGATIVE
Ketones, ur: NEGATIVE
Protein, ur: NEGATIVE
Urobilinogen, UA: 0.2

## 2011-08-26 LAB — POCT PREGNANCY, URINE: Preg Test, Ur: POSITIVE

## 2011-08-29 LAB — URINALYSIS, ROUTINE W REFLEX MICROSCOPIC
Bilirubin Urine: NEGATIVE
Glucose, UA: NEGATIVE
Hgb urine dipstick: NEGATIVE
Ketones, ur: NEGATIVE
Protein, ur: NEGATIVE

## 2011-08-29 LAB — WET PREP, GENITAL
Trich, Wet Prep: NONE SEEN
Yeast Wet Prep HPF POC: NONE SEEN

## 2011-08-29 LAB — GC/CHLAMYDIA PROBE AMP, GENITAL
Chlamydia, DNA Probe: NEGATIVE
GC Probe Amp, Genital: NEGATIVE

## 2012-07-04 ENCOUNTER — Emergency Department (HOSPITAL_COMMUNITY)
Admission: EM | Admit: 2012-07-04 | Discharge: 2012-07-04 | Disposition: A | Discharge status: Home / Self Care | Payer: Self-pay | Attending: Emergency Medicine | Admitting: Emergency Medicine

## 2012-07-04 ENCOUNTER — Encounter (HOSPITAL_COMMUNITY): Payer: Self-pay | Admitting: *Deleted

## 2012-07-04 DIAGNOSIS — K219 Gastro-esophageal reflux disease without esophagitis: Secondary | ICD-10-CM | POA: Insufficient documentation

## 2012-07-04 DIAGNOSIS — R0789 Other chest pain: Secondary | ICD-10-CM | POA: Insufficient documentation

## 2012-07-04 MED ORDER — FAMOTIDINE 20 MG PO TABS: 20.0000 mg | ORAL_TABLET | Freq: Two times a day (BID) | ORAL | Status: DC

## 2012-07-04 MED ORDER — GI COCKTAIL ~~LOC~~
30.0000 mL | Freq: Once | ORAL | Status: AC
  Administered 2012-07-04: 30 mL via ORAL
  Filled 2012-07-04: qty 30

## 2012-07-04 NOTE — ED Provider Notes (Signed)
History     CSN: [id number]  Arrival date & time 07/04/12  7829   First MD Initiated Contact with Patient 07/04/12 0840      Chief Complaint  Patient presents with  . Chest Pain    (Consider location/radiation/quality/duration/timing/severity/associated sxs/prior treatment) HPI Patient is a 26 roll female who presents today complaining of 7/10 substernal chest pain that began at 1 AM following taking a pain medication for headache. Patient noted pain with swallowing. She denies any burning with this. She says that her symptoms have been worse with lying down. She has not taken anything for her pain since this began. She denies any recent cough, fevers, or sick contacts. She has a history of hypertension and was previously treated with hydrochlorothiazide for this. She is no longer taking this medication but also is normotensive on presentation today. She is nondiabetic and has no history of hyperlipidemia. Patient reports worsening in her pain she takes a deep breath she is not on any exogenous estrogens. Patient is a smoker. 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 She does not endorse significant increase in her caffeine intake but does admit to drinking coffee at night now. There are no other associated or modifying factors. History reviewed. No pertinent past medical history.  Past Surgical History  Procedure Date  . Appendectomy   . Cholecystectomy   . Right knee     History reviewed. No pertinent family history.  History  Substance Use Topics  . Smoking status: Current Everyday Smoker  . Smokeless tobacco: Not on file  . Alcohol Use: No    OB History    Grav Para Term Preterm Abortions TAB SAB Ect Mult Living                  Review of Systems  Constitutional: Negative.   HENT: Negative.   Eyes: Negative.   Cardiovascular: Positive for chest pain.  Gastrointestinal: Negative.   Genitourinary: Negative.   Musculoskeletal: Negative.   Skin:  Negative.   Neurological: Negative.   Hematological: Negative.   Psychiatric/Behavioral: Negative.   All other systems reviewed and are negative.    Allergies  Penicillins  Home Medications   Current Outpatient Rx  Name Route Sig Dispense Refill  . IBUPROFEN 200 MG PO TABS Oral Take 400 mg by mouth every 8 (eight) hours as needed. For pain.    Marland Kitchen FAMOTIDINE 20 MG PO TABS Oral Take 1 tablet (20 mg total) by mouth 2 (two) times daily. 60 tablet 2    BP 125/78  Pulse 77  Resp 18  SpO2 100%  Physical Exam  Nursing note and vitals reviewed. GEN: Well-developed, well-nourished female in no distress HEENT: Atraumatic, normocephalic. Oropharynx clear without erythema EYES: PERRLA BL, no scleral icterus. NECK: Trachea midline, no meningismus CV: regular rate and rhythm. No murmurs, rubs, or gallops PULM: No respiratory distress.  No crackles, wheezes, or rales. GI: soft, non-tender. No guarding, rebound, or tenderness. + bowel sounds  GU: deferred Neuro: cranial nerves grossly 2-12 intact, no abnormalities of strength or sensation, A and O x 3 MSK: Patient moves all 4 extremities symmetrically, no deformity, edema, or injury noted Skin: No rashes petechiae, purpura, or jaundice Psych: no abnormality of mood   ED Course  Procedures (including critical care time)  Indication: Chest pain Please note this EKG was reviewed extemporaneously by myself.  Date: 07/04/2012  Rate: 80  Rhythm: normal sinus rhythm  QRS Axis: normal  Intervals: normal  ST/T  Wave abnormalities: normal  Conduction Disutrbances:none  Narrative Interpretation:   Old EKG Reviewed: none available      Labs Reviewed - No data to display No results found.   1. Chest pain, non-cardiac   2. GERD (gastroesophageal reflux disease)       MDM  Patient was evaluated by myself. Based on timing of symptoms as well as exacerbating factors the symptoms are most consistent with episode of reflux as well as  possible discomfort from ingestion of pills. Patient was hemodynamically stable. EKG showed no remarkable findings. Patient did describe some worsening with deep inspiration but otherwise had no significant risk factors for thromboembolic disease and did not require further testing. She had no fevers or cough that might suggest a pneumonia. Patient was treated with GI cocktail. Pain improved from 7/10-2/10. Patient and I discussed that with her new nighttime position she would have to be careful regarding her amount of caffeine in coffee intake if this might have caused her chest pain. Patient was given a prescription for Pepcid. She's not currently have a PCP in 2 months refills were provided. Patient was given resource guide. She was advised to stop smoking. Patient was discharged in good condition.        Cyndra Numbers, MD 07/04/12 [phone]

## 2012-07-04 NOTE — ED Notes (Signed)
Pt reports onset of central chest pain around 0100 last night. Pt reports pain worse with deep breath. No cardiac hx. No associated symptoms. Non-radiating.

## 2012-07-20 ENCOUNTER — Emergency Department (HOSPITAL_COMMUNITY)
Admission: EM | Admit: 2012-07-20 | Discharge: 2012-07-21 | Disposition: A | Discharge status: Home / Self Care | Payer: Self-pay | Attending: Emergency Medicine | Admitting: Emergency Medicine

## 2012-07-20 ENCOUNTER — Encounter (HOSPITAL_COMMUNITY): Payer: Self-pay | Admitting: *Deleted

## 2012-07-20 ENCOUNTER — Emergency Department (HOSPITAL_COMMUNITY): Payer: Self-pay

## 2012-07-20 DIAGNOSIS — R102 Pelvic and perineal pain: Secondary | ICD-10-CM

## 2012-07-20 DIAGNOSIS — N949 Unspecified condition associated with female genital organs and menstrual cycle: Secondary | ICD-10-CM | POA: Insufficient documentation

## 2012-07-20 DIAGNOSIS — Z9089 Acquired absence of other organs: Secondary | ICD-10-CM | POA: Insufficient documentation

## 2012-07-20 DIAGNOSIS — F17200 Nicotine dependence, unspecified, uncomplicated: Secondary | ICD-10-CM | POA: Insufficient documentation

## 2012-07-20 LAB — URINALYSIS, ROUTINE W REFLEX MICROSCOPIC
Bilirubin Urine: NEGATIVE
Hgb urine dipstick: NEGATIVE
Ketones, ur: NEGATIVE mg/dL
Nitrite: NEGATIVE
Urobilinogen, UA: 1 mg/dL (ref 0.0–1.0)

## 2012-07-20 MED ORDER — ONDANSETRON 8 MG PO TBDP
8.0000 mg | ORAL_TABLET | Freq: Once | ORAL | Status: AC
  Administered 2012-07-20: 8 mg via ORAL
  Filled 2012-07-20: qty 1

## 2012-07-20 MED ORDER — HYDROMORPHONE HCL PF 1 MG/ML IJ SOLN
1.0000 mg | Freq: Once | INTRAMUSCULAR | Status: AC
  Administered 2012-07-20: 1 mg via INTRAMUSCULAR
  Filled 2012-07-20: qty 1

## 2012-07-20 MED ORDER — IBUPROFEN 800 MG PO TABS
800.0000 mg | ORAL_TABLET | Freq: Once | ORAL | Status: AC
  Administered 2012-07-20: 800 mg via ORAL
  Filled 2012-07-20: qty 1

## 2012-07-20 NOTE — ED Notes (Signed)
Pt c/o L pelvic pain starting yesterday. Pt denies dysuria but c/o strong odor to urine. Pt denies vaginal discharge and bleeding. Pt c/o nausea, denies vomiting.

## 2012-07-20 NOTE — ED Provider Notes (Signed)
History     CSN: [id number]  Arrival date & time 07/20/12  1858   First MD Initiated Contact with Patient 07/20/12 2259      Chief Complaint  Patient presents with  . Pelvic Pain    (Consider location/radiation/quality/duration/timing/severity/associated sxs/prior treatment) HPI HX per PT. Onset yesterday, on and off L pelvic pain, feels like a sharp stabbing pain, no h/o same. Some LBP. No F/C. Nausea but no vomiting, no diarrhea. No rash or lesion, vag bleeding or discharge, mod in severity, took tylenol that "dont work". LMP 3 years ago -has IUD. No PCP or GYN.   History reviewed. No pertinent past medical history.  Past Surgical History  Procedure Date  . Appendectomy   . Cholecystectomy   . Right knee     History reviewed. No pertinent family history.  History  Substance Use Topics  . Smoking status: Current Everyday Smoker  . Smokeless tobacco: Not on file  . Alcohol Use: No    OB History    Grav Para Term Preterm Abortions TAB SAB Ect Mult Living                  Review of Systems  Constitutional: Negative for fever and chills.  HENT: Negative for neck pain and neck stiffness.   Eyes: Negative for pain.  Respiratory: Negative for shortness of breath.   Cardiovascular: Negative for chest pain.  Gastrointestinal: Negative for abdominal pain.  Genitourinary: Positive for pelvic pain. Negative for dysuria, urgency, frequency, vaginal bleeding and vaginal discharge.  Musculoskeletal: Negative for back pain.  Skin: Negative for rash.  Neurological: Negative for headaches.  All other systems reviewed and are negative.    Allergies  Penicillins  Home Medications   Current Outpatient Rx  Name Route Sig Dispense Refill  . ACETAMINOPHEN 500 MG PO TABS Oral Take 1,000 mg by mouth every 6 (six) hours as needed. Pain    . CALCIUM CARBONATE ANTACID 500 MG PO CHEW Oral Chew 1 tablet by mouth daily.    Marland Kitchen FAMOTIDINE 20 MG PO TABS Oral Take 20 mg by mouth 2 (two)  times daily.    . IBUPROFEN 200 MG PO TABS Oral Take 400 mg by mouth every 8 (eight) hours as needed. For pain.      BP 112/64  Pulse 68  Temp 98.7 F (37.1 C) (Oral)  Resp 20  SpO2 100%  Physical Exam  Constitutional: She is oriented to person, place, and time. She appears well-developed and well-nourished.  HENT:  Head: Normocephalic and atraumatic.  Eyes: Conjunctivae and EOM are normal. Pupils are equal, round, and reactive to light.  Neck: Trachea normal. Neck supple. No thyromegaly present.  Cardiovascular: Normal rate, regular rhythm, S1 normal, S2 normal and normal pulses.     No systolic murmur is present   No diastolic murmur is present  Pulses:      Radial pulses are 2+ on the right side, and 2+ on the left side.  Pulmonary/Chest: Effort normal and breath sounds normal. She has no wheezes. She has no rhonchi. She has no rales. She exhibits no tenderness.  Abdominal: Soft. Normal appearance and bowel sounds are normal. There is no CVA tenderness and negative Murphy's sign.       Tender L pelvic/ LLQ no peritonitis  Musculoskeletal:       BLE:s Calves nontender, no cords or erythema, negative Homans sign  Neurological: She is alert and oriented to person, place, and time. She has normal strength. No  cranial nerve deficit or sensory deficit. GCS eye subscore is 4. GCS verbal subscore is 5. GCS motor subscore is 6.  Skin: Skin is warm and dry. No rash noted. She is not diaphoretic.  Psychiatric: Her speech is normal.       Cooperative and appropriate    ED Course  Pelvic exam Date/Time: 07/21/2012 1:01 AM Performed by: Sunnie Nielsen Authorized by: Sunnie Nielsen Consent: Verbal consent obtained. Risks and benefits: risks, benefits and alternatives were discussed Consent given by: patient Patient understanding: patient states understanding of the procedure being performed Patient consent: the patient's understanding of the procedure matches consent given Procedure  consent: procedure consent matches procedure scheduled Required items: required blood products, implants, devices, and special equipment available Patient identity confirmed: verbally with patient Time out: Immediately prior to procedure a "time out" was called to verify the correct patient, procedure, equipment, support staff and site/side marked as required. Preparation: Patient was prepped and draped in the usual sterile fashion. Patient tolerance: Patient tolerated the procedure well with no immediate complications. Comments: Mod white d/c, no CMT, mod L adnexal tenderness    Results for orders placed during the hospital encounter of 07/20/12  URINALYSIS, ROUTINE W REFLEX MICROSCOPIC      Component Value Range   Color, Urine YELLOW  YELLOW   APPearance CLOUDY (*) CLEAR   Specific Gravity, Urine 1.019  1.005 - 1.030   pH 7.0  5.0 - 8.0   Glucose, UA NEGATIVE  NEGATIVE mg/dL   Hgb urine dipstick NEGATIVE  NEGATIVE   Bilirubin Urine NEGATIVE  NEGATIVE   Ketones, ur NEGATIVE  NEGATIVE mg/dL   Protein, ur NEGATIVE  NEGATIVE mg/dL   Urobilinogen, UA 1.0  0.0 - 1.0 mg/dL   Nitrite NEGATIVE  NEGATIVE   Leukocytes, UA SMALL (*) NEGATIVE  PREGNANCY, URINE      Component Value Range   Preg Test, Ur NEGATIVE  NEGATIVE  URINE MICROSCOPIC-ADD ON      Component Value Range   Squamous Epithelial / LPF FEW (*) RARE   WBC, UA 0-2  <3 WBC/hpf   Bacteria, UA RARE  RARE  WET PREP, GENITAL      Component Value Range   Yeast Wet Prep HPF POC NONE SEEN  NONE SEEN   Trich, Wet Prep NONE SEEN  NONE SEEN   Clue Cells Wet Prep HPF POC FEW (*) NONE SEEN   WBC, Wet Prep HPF POC MANY (*) NONE SEEN   US Transvaginal Non-ob  07/21/2012  *RADIOLOGY REPORT*  Clinical Data: Left-sided pelvic pain.  IUD.  TRANSABDOMINAL AND TRANSVAGINAL ULTRASOUND OF PELVIS  Technique:  Both transabdominal and transvaginal ultrasound examinations of the pelvis were performed.  Transabdominal technique was performed for global  imaging of the pelvis including uterus, ovaries, adnexal regions, and pelvic cul-de-sac.  It was necessary to proceed with endovaginal exam following the transabdominal exam to visualize the IUD and ovaries.  Comparison:  None.  Findings: Uterus:  7.4 x 4.2 x 5.6 cm.  No fibroids or other uterine mass identified.  Endometrium: IUD is visualized within the endometrial cavity. Acoustic shadowing from the IUD limits visualization of endometrium.  Right ovary: 3.6 x 2.4 x 2.0 cm.  Normal appearance.  No adnexal mass identified.  Left ovary: 3.9 x 2.6 of 4.0 cm.  Normal appearance.  Small corpus luteum cyst noted.  No ovarian or adnexal mass identified.  Other Findings:  A small amount of free fluid noted in pelvic cul- de-sac.  IMPRESSION:  1.  Normal appearance of uterus and both ovaries.  No pelvic mass identified. 2.  IUD in normal location.   Original Report Authenticated By: Danae Orleans, M.D.    US Pelvis Complete  07/21/2012  *RADIOLOGY REPORT*  Clinical Data: Left-sided pelvic pain.  IUD.  TRANSABDOMINAL AND TRANSVAGINAL ULTRASOUND OF PELVIS  Technique:  Both transabdominal and transvaginal ultrasound examinations of the pelvis were performed.  Transabdominal technique was performed for global imaging of the pelvis including uterus, ovaries, adnexal regions, and pelvic cul-de-sac.  It was necessary to proceed with endovaginal exam following the transabdominal exam to visualize the IUD and ovaries.  Comparison:  None.  Findings: Uterus:  7.4 x 4.2 x 5.6 cm.  No fibroids or other uterine mass identified.  Endometrium: IUD is visualized within the endometrial cavity. Acoustic shadowing from the IUD limits visualization of endometrium.  Right ovary: 3.6 x 2.4 x 2.0 cm.  Normal appearance.  No adnexal mass identified.  Left ovary: 3.9 x 2.6 of 4.0 cm.  Normal appearance.  Small corpus luteum cyst noted.  No ovarian or adnexal mass identified.  Other Findings:  A small amount of free fluid noted in pelvic cul-  de-sac.  IMPRESSION:  1.  Normal appearance of uterus and both ovaries.  No pelvic mass identified. 2.  IUD in normal location.   Original Report Authenticated By: Danae Orleans, M.D.     L pelvic pain.  IM dilaudid. treated for possible cervicitis. GC/ chl cultures pending.   On recheck pain improved, no Acute ABD. [place] does not show torsion or cyst. Pelvic pain and infx precautions verbalized as understood.   Referral to health dept and womens clinic  MDM   VS and nursing notes reviewed. [place]. UA. Wet prep. Pelvic exam. IM narcotics. Outpatient follow up.         Sunnie Nielsen, MD 07/21/12 [phone]

## 2012-07-20 NOTE — ED Notes (Signed)
MD at bedside. 

## 2012-07-21 LAB — WET PREP, GENITAL
Trich, Wet Prep: NONE SEEN
Yeast Wet Prep HPF POC: NONE SEEN

## 2012-07-21 LAB — GC/CHLAMYDIA PROBE AMP, GENITAL: GC Probe Amp, Genital: NEGATIVE

## 2012-07-21 MED ORDER — DOXYCYCLINE HYCLATE 100 MG PO CAPS: 100.0000 mg | ORAL_CAPSULE | Freq: Two times a day (BID) | ORAL | Status: AC

## 2012-07-21 MED ORDER — CEFTRIAXONE SODIUM 250 MG IJ SOLR
250.0000 mg | Freq: Once | INTRAMUSCULAR | Status: AC
  Administered 2012-07-21: 250 mg via INTRAMUSCULAR
  Filled 2012-07-21: qty 250

## 2012-07-21 MED ORDER — AZITHROMYCIN 250 MG PO TABS
1000.0000 mg | ORAL_TABLET | Freq: Once | ORAL | Status: AC
  Administered 2012-07-21: 1000 mg via ORAL
  Filled 2012-07-21: qty 4

## 2012-07-21 MED ORDER — METRONIDAZOLE 500 MG PO TABS: 500.0000 mg | ORAL_TABLET | Freq: Two times a day (BID) | ORAL | Status: AC

## 2012-07-21 MED ORDER — IBUPROFEN 800 MG PO TABS: 800.0000 mg | ORAL_TABLET | Freq: Three times a day (TID) | ORAL | Status: AC

## 2012-07-21 NOTE — ED Notes (Signed)
Patient transported to Ultrasound and returned 

## 2012-11-03 ENCOUNTER — Encounter (HOSPITAL_COMMUNITY): Payer: Self-pay | Admitting: Emergency Medicine

## 2012-11-03 ENCOUNTER — Emergency Department (HOSPITAL_COMMUNITY)
Admission: EM | Admit: 2012-11-03 | Discharge: 2012-11-03 | Disposition: A | Discharge status: Home / Self Care | Payer: Self-pay | Attending: Emergency Medicine | Admitting: Emergency Medicine

## 2012-11-03 DIAGNOSIS — F17200 Nicotine dependence, unspecified, uncomplicated: Secondary | ICD-10-CM | POA: Insufficient documentation

## 2012-11-03 DIAGNOSIS — R509 Fever, unspecified: Secondary | ICD-10-CM | POA: Insufficient documentation

## 2012-11-03 DIAGNOSIS — J111 Influenza due to unidentified influenza virus with other respiratory manifestations: Secondary | ICD-10-CM | POA: Insufficient documentation

## 2012-11-03 DIAGNOSIS — I10 Essential (primary) hypertension: Secondary | ICD-10-CM | POA: Insufficient documentation

## 2012-11-03 MED ORDER — ACETAMINOPHEN 325 MG PO TABS
650.0000 mg | ORAL_TABLET | Freq: Once | ORAL | Status: AC
  Administered 2012-11-03: 650 mg via ORAL

## 2012-11-03 MED ORDER — ACETAMINOPHEN 325 MG PO TABS
ORAL_TABLET | ORAL | Status: AC
  Filled 2012-11-03: qty 2

## 2012-11-03 NOTE — ED Notes (Signed)
MD at bedside. 

## 2012-11-03 NOTE — ED Provider Notes (Signed)
History     CSN: [id number]  Arrival date & time 11/03/12  1825   First MD Initiated Contact with Patient 11/03/12 1916      Chief Complaint  Patient presents with  . Influenza    (Consider location/radiation/quality/duration/timing/severity/associated sxs/prior treatment) HPI Comments: Teresa Hobbs is a 26 y.o. female who's been ill less than 24 hours with a fever. She has accompanying myalgias, sore throat, and nausea, without vomiting; no dizziness. She tried Tylenol without resolution of the problem. There are no other modifying factors. 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  Patient is a 26 y.o. female presenting with flu symptoms.  Influenza    History reviewed. No pertinent past medical history.  Past Surgical History  Procedure Date  . Appendectomy   . Cholecystectomy   . Right knee     No family history on file.  History  Substance Use Topics  . Smoking status: Current Every Day Smoker -- 0.5 packs/day    Types: Cigarettes  . Smokeless tobacco: Never Used  . Alcohol Use: No     Comment: ocassionally - vodka    OB History    Grav Para Term Preterm Abortions TAB SAB Ect Mult Living                  Review of Systems  All other systems reviewed and are negative.    Allergies  Penicillins  Home Medications   Current Outpatient Rx  Name  Route  Sig  Dispense  Refill  . ACETAMINOPHEN 500 MG PO TABS   Oral   Take 1,000 mg by mouth every 6 (six) hours as needed. Pain         . NYQUIL PO   Oral   Take 15 mLs by mouth every 6 (six) hours as needed. For flu symptoms.           BP 154/104  Pulse 119  Temp 101.4 F (38.6 C) (Oral)  Resp 20  SpO2 100%  Physical Exam  Nursing note and vitals reviewed. Constitutional: She is oriented to person, place, and time. She appears well-developed and well-nourished. She appears distressed (she appears uncomfortable).  HENT:  Head: Normocephalic and atraumatic.  Eyes: Conjunctivae  normal and EOM are normal. Pupils are equal, round, and reactive to light.  Neck: Normal range of motion and phonation normal. Neck supple.  Cardiovascular: Normal rate, regular rhythm and intact distal pulses.   Pulmonary/Chest: Effort normal and breath sounds normal. She exhibits no tenderness.  Abdominal: Soft. She exhibits no distension. There is no tenderness. There is no guarding.  Musculoskeletal: Normal range of motion.  Neurological: She is alert and oriented to person, place, and time. She has normal strength. She exhibits normal muscle tone.  Skin: Skin is warm and dry.  Psychiatric: She has a normal mood and affect. Her behavior is normal. Judgment and thought content normal.    ED Course  Procedures (including critical care time)  Nursing notes, applicable records and vitals reviewed.  Radiologic Images/Reports reviewed.     1. Influenza       MDM  Signs, and symptoms of influenza. Doubt metabolic instability, serious bacterial infection or impending vascular collapse; the patient is stable for discharge.  Plan: Home Medications- OTC antipyetic; Home Treatments- rest, fluids; Recommended follow up- Return prn      Flint Melter, MD 11/03/12 [phone]

## 2012-11-03 NOTE — ED Notes (Signed)
Onset of headache yesterday, sore throat, body aches, stiff neck and aching all over started today. Denies diarrhea, nausea w/o emesis. Able to take po fluids, drinking orange juice today. Last took Tylenol at 1200

## 2012-11-03 NOTE — ED Notes (Signed)
Went to bed last night around 2200hr had a sore throat & headache.  Woke up around 6:00am headache wore/continued with sore throat. Around 12pm whole  body started hurting, nausea took tylenol due to the headache.  Denies vomiting, Around 1500hr temp. 101.1

## 2012-11-05 ENCOUNTER — Emergency Department (HOSPITAL_COMMUNITY)
Admission: EM | Admit: 2012-11-05 | Discharge: 2012-11-05 | Disposition: A | Discharge status: Home / Self Care | Payer: Self-pay | Attending: Emergency Medicine | Admitting: Emergency Medicine

## 2012-11-05 ENCOUNTER — Encounter (HOSPITAL_COMMUNITY): Payer: Self-pay | Admitting: Emergency Medicine

## 2012-11-05 DIAGNOSIS — R05 Cough: Secondary | ICD-10-CM | POA: Insufficient documentation

## 2012-11-05 DIAGNOSIS — F17200 Nicotine dependence, unspecified, uncomplicated: Secondary | ICD-10-CM | POA: Insufficient documentation

## 2012-11-05 DIAGNOSIS — R509 Fever, unspecified: Secondary | ICD-10-CM | POA: Insufficient documentation

## 2012-11-05 DIAGNOSIS — J020 Streptococcal pharyngitis: Secondary | ICD-10-CM | POA: Insufficient documentation

## 2012-11-05 DIAGNOSIS — R059 Cough, unspecified: Secondary | ICD-10-CM | POA: Insufficient documentation

## 2012-11-05 DIAGNOSIS — H9209 Otalgia, unspecified ear: Secondary | ICD-10-CM | POA: Insufficient documentation

## 2012-11-05 DIAGNOSIS — I10 Essential (primary) hypertension: Secondary | ICD-10-CM | POA: Insufficient documentation

## 2012-11-05 DIAGNOSIS — R110 Nausea: Secondary | ICD-10-CM | POA: Insufficient documentation

## 2012-11-05 DIAGNOSIS — R52 Pain, unspecified: Secondary | ICD-10-CM | POA: Insufficient documentation

## 2012-11-05 HISTORY — DX: Essential (primary) hypertension: I10

## 2012-11-05 MED ORDER — AZITHROMYCIN 250 MG PO TABS: 250.0000 mg | ORAL_TABLET | Freq: Every day | ORAL | Status: AC

## 2012-11-05 MED ORDER — HYDROCODONE-ACETAMINOPHEN 7.5-500 MG/15ML PO SOLN: 15.0000 mL | Freq: Four times a day (QID) | ORAL | Status: AC | PRN

## 2012-11-05 MED ORDER — AZITHROMYCIN 250 MG PO TABS
500.0000 mg | ORAL_TABLET | Freq: Once | ORAL | Status: AC
  Administered 2012-11-05: 500 mg via ORAL
  Filled 2012-11-05: qty 2

## 2012-11-05 NOTE — ED Provider Notes (Signed)
History   This chart was scribed for Allison Silva B. Bernette Mayers, MD, by Frederik Pear, ER scribe. The patient was seen in room TR10C/TR10C and the patient's care was started at 1413.   CSN: [id number]  Arrival date & time 11/05/12  1406   First MD Initiated Contact with Patient 11/05/12 1413      Chief Complaint  Patient presents with  . Sore Throat  . Fever  . Otalgia  . Generalized Body Aches    (Consider location/radiation/quality/duration/timing/severity/associated sxs/prior treatment) HPI Teresa Hobbs is a 26 y.o. female who presents to the Emergency Department complaining of a constant, gradually worsening sore throat with associated body aches, nausea, fever, bilateral otalgia that is worse on the right, and mild coughing that began 3 days ago. She states that she was seen 2 days ago in the [facility] ED and diagnosed with influenza, but her symptoms have worsened and changed since then. She reports that she has been taking Nyquil at home with no relief. She denies any emesis. She states that she is allergic to penicillin.   Past Medical History  Diagnosis Date  . Hypertension     Past Surgical History  Procedure Date  . Appendectomy   . Cholecystectomy   . Right knee     History reviewed. No pertinent family history.  History  Substance Use Topics  . Smoking status: Current Every Day Smoker -- 0.5 packs/day    Types: Cigarettes  . Smokeless tobacco: Never Used  . Alcohol Use: No     Comment: ocassionally - vodka    OB History    Grav Para Term Preterm Abortions TAB SAB Ect Mult Living                  Review of Systems A complete 10 system review of systems was obtained and all systems are negative except as noted in the HPI and PMH.  Allergies  Penicillins  Home Medications   Current Outpatient Rx  Name  Route  Sig  Dispense  Refill  . ACETAMINOPHEN 500 MG PO TABS   Oral   Take 1,000 mg by mouth every 6 (six) hours as needed. Pain         . NYQUIL  PO   Oral   Take 15 mLs by mouth every 6 (six) hours as needed. For flu symptoms.           BP 141/94  Pulse 108  Temp 99.3 F (37.4 C) (Oral)  Resp 18  SpO2 99%  Physical Exam  Nursing note and vitals reviewed. Constitutional: She is oriented to person, place, and time. She appears well-developed and well-nourished.  HENT:  Head: Normocephalic and atraumatic.       Her tonsils are swollen bilaterally with exudates.  Eyes: EOM are normal. Pupils are equal, round, and reactive to light.  Neck: Normal range of motion. Neck supple.       She has mild bilateral cervical lymphadenopathy.  Cardiovascular: Normal rate, normal heart sounds and intact distal pulses.   Pulmonary/Chest: Effort normal and breath sounds normal.  Abdominal: Bowel sounds are normal. She exhibits no distension. There is no tenderness.  Musculoskeletal: Normal range of motion. She exhibits no edema and no tenderness.  Lymphadenopathy:    She has cervical adenopathy.  Neurological: She is alert and oriented to person, place, and time. She has normal strength. No cranial nerve deficit or sensory deficit.  Skin: Skin is warm and dry. No rash noted.  Psychiatric: She  has a normal mood and affect.    ED Course  Procedures (including critical care time)  DIAGNOSTIC STUDIES: Oxygen Saturation is 99% on room air, normal by my interpretation.    COORDINATION OF CARE:  14:21- Discussed planned course of treatment with the patient, including antibiotics, who is agreeable at this time.   Labs Reviewed - No data to display No results found.   No diagnosis found.    MDM  Pt with symptoms and exam consistent with strep pharyngitis. Will treat empirically with Z-pak due to PCN allergy.    I personally performed the services described in this documentation, which was scribed in my presence. The recorded information has been reviewed and is accurate.        Yissel Habermehl B. Bernette Mayers, MD 11/05/12 1434

## 2012-11-05 NOTE — ED Notes (Signed)
Pt c/o sore throat with fever and body aches; pt sts bil ear pain

## 2013-01-03 IMAGING — US US PELVIS COMPLETE
1 series · 14 of 25 positions shown · non-contrast
Comparison: None.

CLINICAL DATA: Left-sided pelvic pain.  IUD.



[Series 1: us pelvis complete · 0.28mm/px · 14 of 39 slices shown]
[im 1/39]
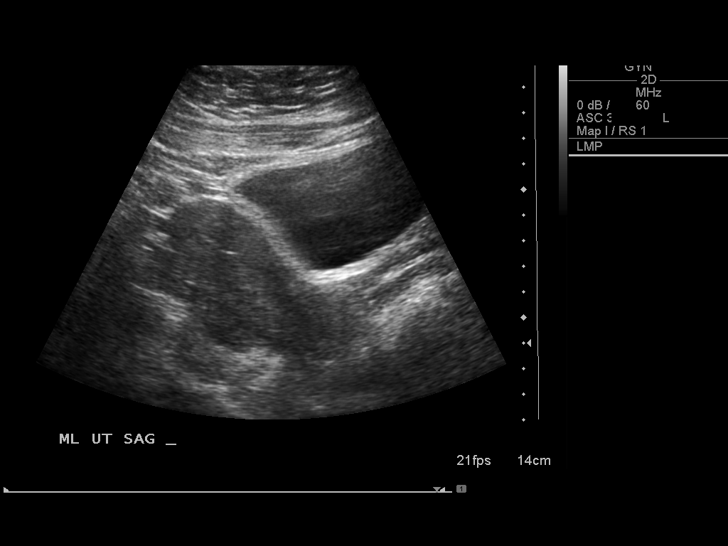
[im 4/39]
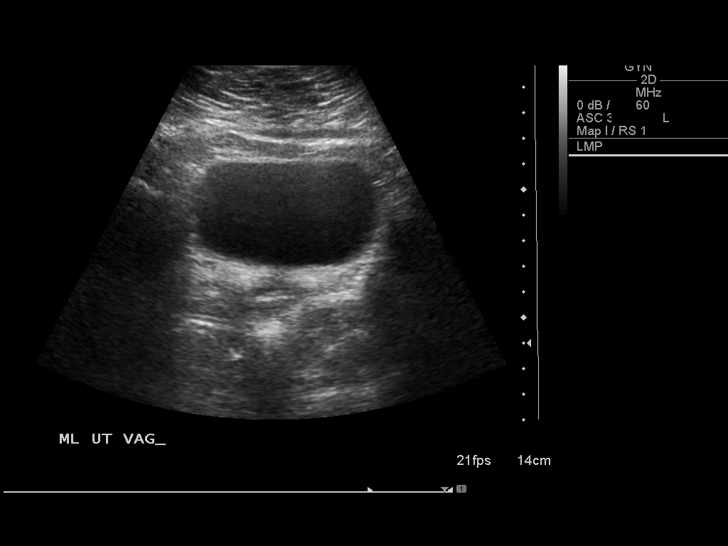
[im 7/39]
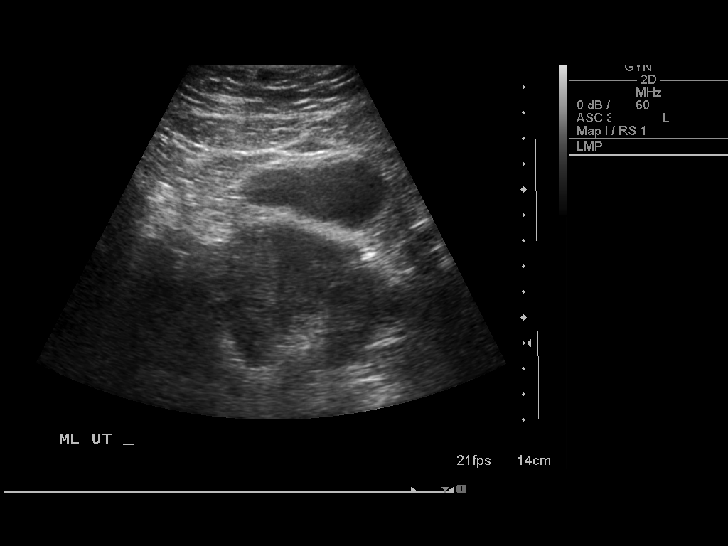
[im 10/39]
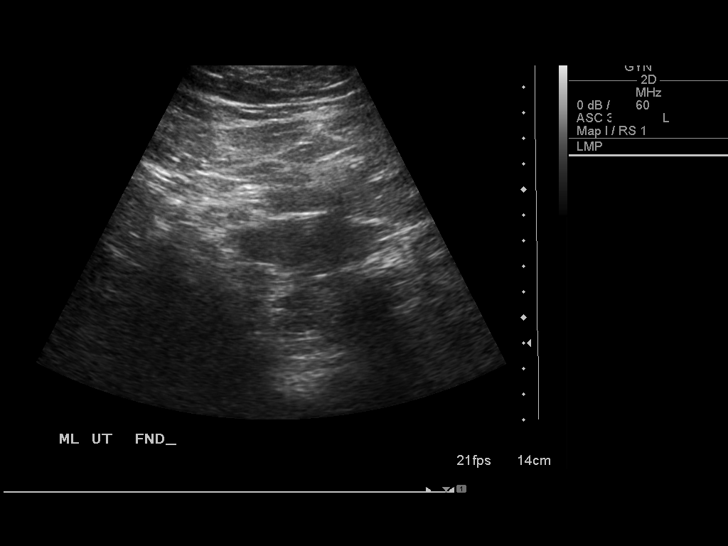
[im 13/39]
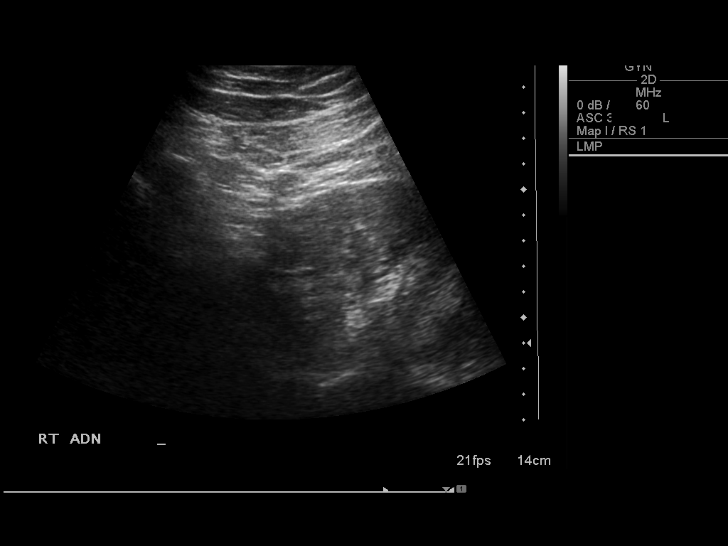
[im 15/39]
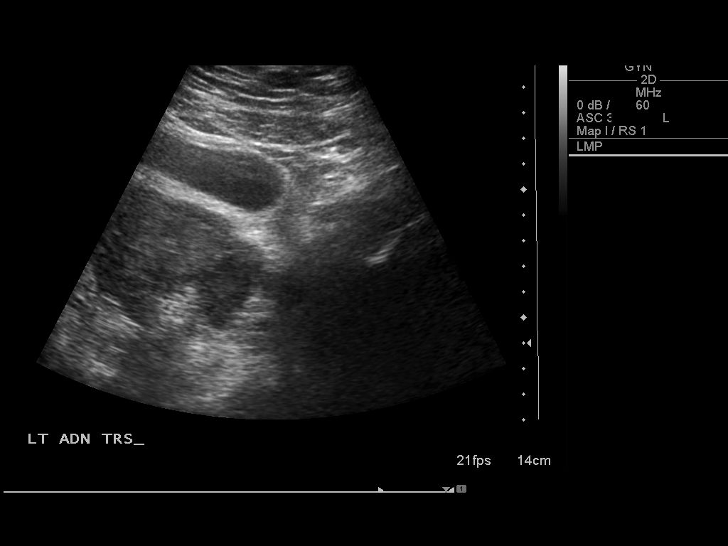
[im 18/39]
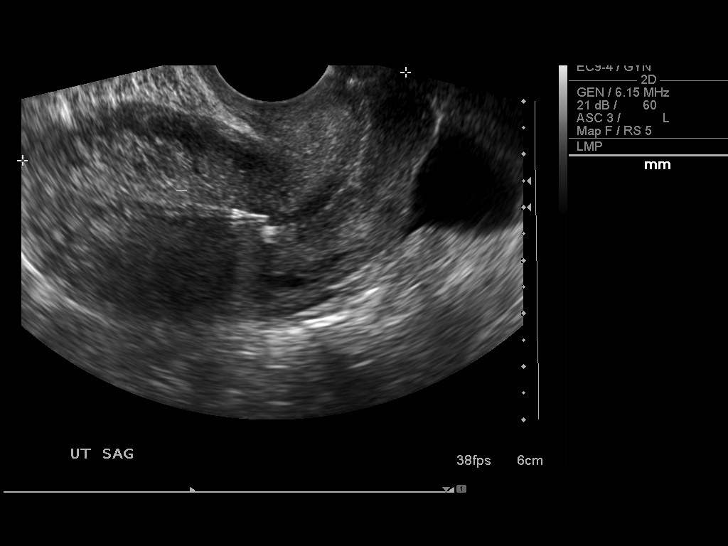
[im 21/39]
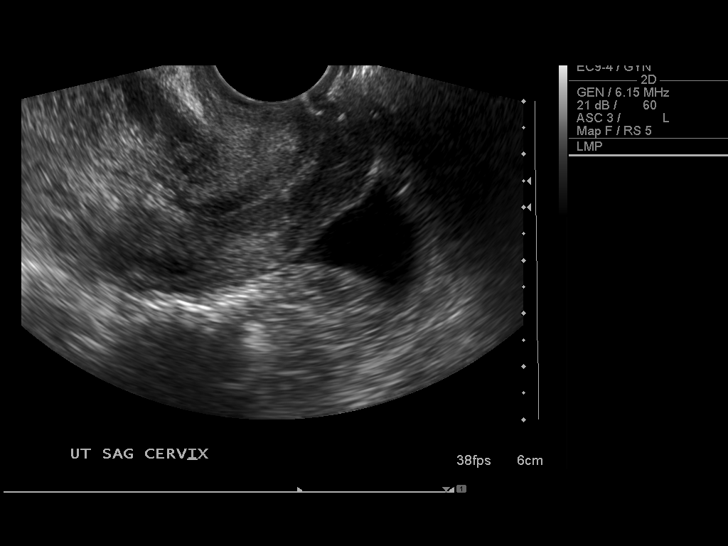
[im 24/39]
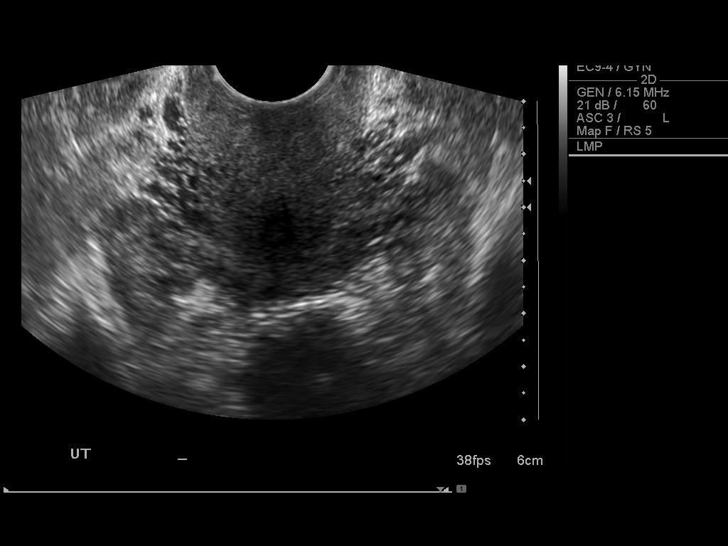
[im 26/39]
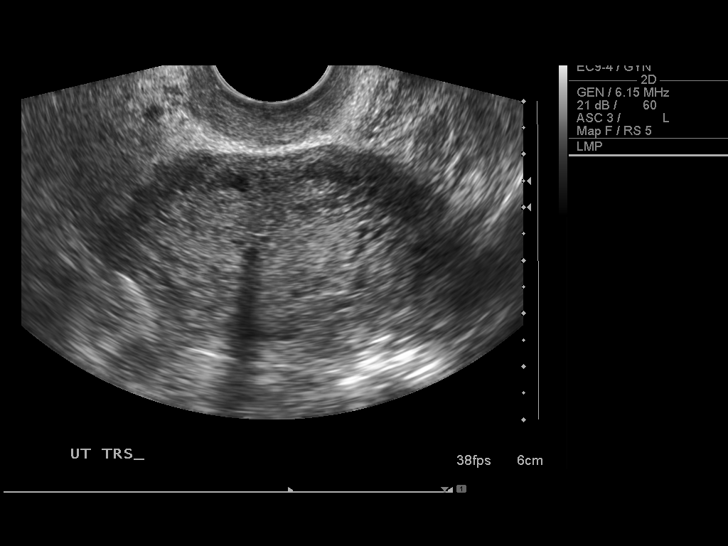
[im 29/39]
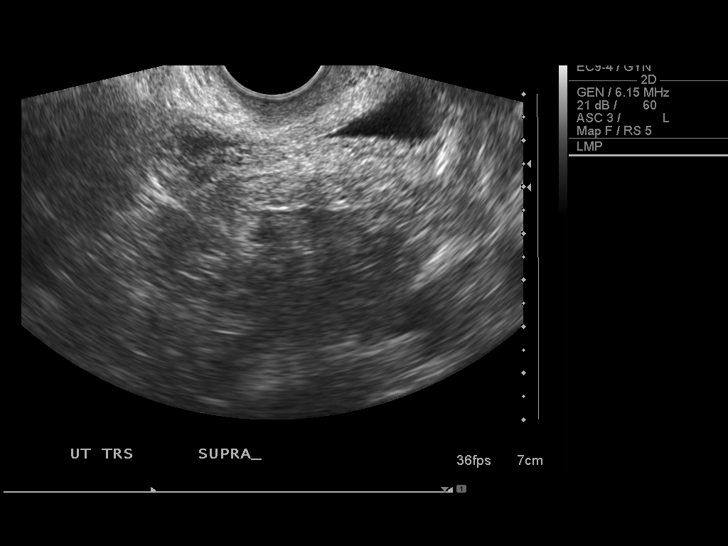
[im 32/39]
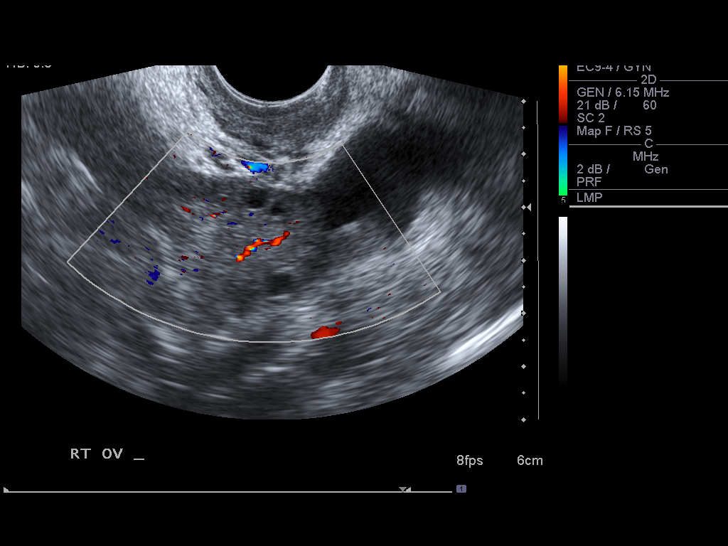
[im 35/39]
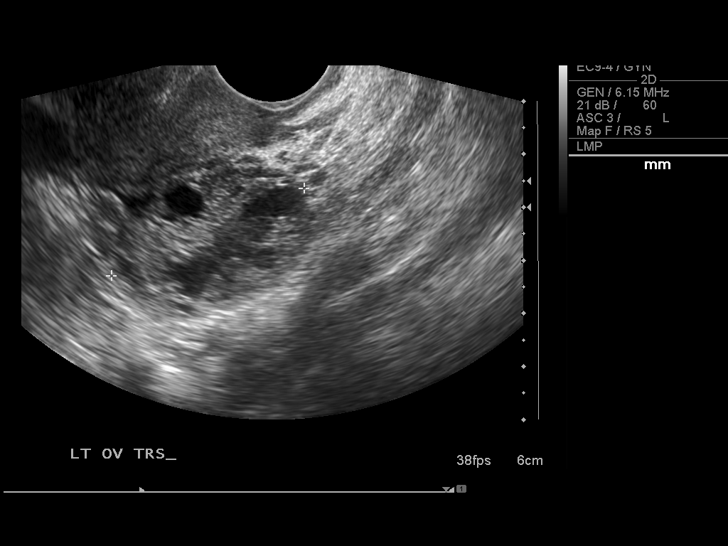
[im 39/39]
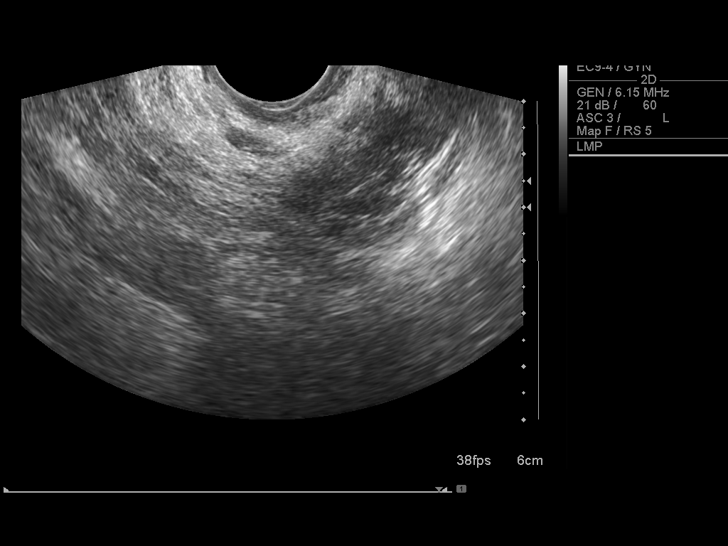

[14 of 25 positions shown; findings below may reference images not displayed]

FINDINGS: Uterus:  7.4 x 4.2 x 5.6 cm.  No fibroids or other uterine mass
identified.

Endometrium: IUD is visualized within the endometrial cavity.
Acoustic shadowing from the IUD limits visualization of
endometrium.

Right ovary: 3.6 x 2.4 x 2.0 cm.  Normal appearance.  No adnexal
mass identified.

Left ovary: 3.9 x 2.6 of 4.0 cm.  Normal appearance.  Small corpus
luteum cyst noted.  No ovarian or adnexal mass identified.

Other Findings:  A small amount of free fluid noted in pelvic cul-
de-sac.
IMPRESSION: 1.  Normal appearance of uterus and both ovaries.  No pelvic mass
identified.
2.  IUD in normal location.

## 2022-06-09 ENCOUNTER — Other Ambulatory Visit: Payer: Self-pay

## 2022-06-09 ENCOUNTER — Emergency Department (HOSPITAL_BASED_OUTPATIENT_CLINIC_OR_DEPARTMENT_OTHER): Payer: Commercial Managed Care - PPO | Admitting: Radiology

## 2022-06-09 ENCOUNTER — Other Ambulatory Visit (HOSPITAL_BASED_OUTPATIENT_CLINIC_OR_DEPARTMENT_OTHER): Payer: Self-pay | Admitting: Radiology

## 2022-06-09 ENCOUNTER — Encounter (HOSPITAL_BASED_OUTPATIENT_CLINIC_OR_DEPARTMENT_OTHER): Payer: Self-pay

## 2022-06-09 ENCOUNTER — Emergency Department (HOSPITAL_BASED_OUTPATIENT_CLINIC_OR_DEPARTMENT_OTHER)
Admission: EM | Admit: 2022-06-09 | Discharge: 2022-06-09 | Disposition: A | Discharge status: Home / Self Care | Payer: Commercial Managed Care - PPO | Attending: Emergency Medicine | Admitting: Emergency Medicine

## 2022-06-09 DIAGNOSIS — R079 Chest pain, unspecified: Secondary | ICD-10-CM | POA: Insufficient documentation

## 2022-06-09 DIAGNOSIS — Z79899 Other long term (current) drug therapy: Secondary | ICD-10-CM | POA: Diagnosis not present

## 2022-06-09 DIAGNOSIS — R0602 Shortness of breath: Secondary | ICD-10-CM | POA: Diagnosis not present

## 2022-06-09 DIAGNOSIS — R209 Unspecified disturbances of skin sensation: Secondary | ICD-10-CM | POA: Diagnosis not present

## 2022-06-09 DIAGNOSIS — I10 Essential (primary) hypertension: Secondary | ICD-10-CM | POA: Insufficient documentation

## 2022-06-09 LAB — CBC
HCT: 37.8 % (ref 36.0–46.0)
Hemoglobin: 12.8 g/dL (ref 12.0–15.0)
MCH: 26.9 pg (ref 26.0–34.0)
MCHC: 33.9 g/dL (ref 30.0–36.0)
MCV: 79.4 fL — ABNORMAL LOW (ref 80.0–100.0)
Platelets: 352 10*3/uL (ref 150–400)
RBC: 4.76 MIL/uL (ref 3.87–5.11)
RDW: 12.8 % (ref 11.5–15.5)
WBC: 7.2 10*3/uL (ref 4.0–10.5)
nRBC: 0 % (ref 0.0–0.2)

## 2022-06-09 LAB — BASIC METABOLIC PANEL
Anion gap: 11 (ref 5–15)
BUN: 7 mg/dL (ref 6–20)
CO2: 26 mmol/L (ref 22–32)
Calcium: 9 mg/dL (ref 8.9–10.3)
Chloride: 103 mmol/L (ref 98–111)
Creatinine, Ser: 0.78 mg/dL (ref 0.44–1.00)
GFR, Estimated: >60 mL/min (ref >60)
Glucose, Bld: 103 mg/dL — ABNORMAL HIGH (ref 70–99)
Potassium: 3.5 mmol/L (ref 3.5–5.1)
Sodium: 140 mmol/L (ref 135–145)

## 2022-06-09 LAB — TROPONIN I (HIGH SENSITIVITY)
Troponin I (High Sensitivity): 4 ng/L (ref <18)
Troponin I (High Sensitivity): 3 ng/L (ref <18)

## 2022-06-09 MED ORDER — AMLODIPINE BESYLATE 5 MG PO TABS: 5.0000 mg | ORAL_TABLET | Freq: Once | ORAL | Status: DC

## 2022-06-09 MED ORDER — AMLODIPINE BESYLATE 5 MG PO TABS: 5.0000 mg | ORAL_TABLET | Freq: Every day | ORAL | 0 refills | Status: AC

## 2022-06-09 NOTE — Discharge Instructions (Signed)
Take the medication as prescribed.  Follow-up with a primary care doctor to be rechecked.

## 2022-06-09 NOTE — ED Triage Notes (Signed)
Pt presents Left side chest pain, SOB and Left arm tingling since yesterday. Pt here visiting from out of town.

## 2022-06-09 NOTE — ED Provider Notes (Signed)
[facility] EMERGENCY DEPT Provider Note   CSN: [id number] Arrival date & time: 06/09/22  1336     History  Chief Complaint  Patient presents with   Chest Pain   Shortness of Breath    Teresa Hobbs is a 36 y.o. female.   Chest Pain Associated symptoms: shortness of breath   Associated symptoms: no fever   Shortness of Breath Associated symptoms: chest pain   Associated symptoms: no fever      Pt has been having chest pain and tingling in the left arm since last night.  The sx come and go.  She feels her chest flutter when this happens.  The pain has been more constant the fluttering has been more irregular.  No fevers.  No cough.  No vomiting, some nausea.  No sweating.  No hx of heart or lung problems.  Home Medications Prior to Admission medications   Medication Sig Start Date End Date Taking? Authorizing Provider  amLODipine (NORVASC) 5 MG tablet Take 1 tablet (5 mg total) by mouth daily. 06/09/22 07/09/22 Yes Linwood Dibbles, MD  azithromycin (ZITHROMAX) 250 MG tablet Take 1 tablet (250 mg total) by mouth daily. 11/05/12   Pollyann Savoy, MD  DM-Phenylephrine-Acetaminophen (VICKS DAYQUIL COLD & FLU) 10-5-325 MG/15ML LIQD Take 15 mLs by mouth every 6 (six) hours as needed. For cold symptoms    [provider]  HYDROcodone-acetaminophen (LORTAB) 7.5-500 MG/15ML solution Take 15 mLs by mouth every 6 (six) hours as needed for pain. 11/05/12   Pollyann Savoy, MD  naproxen sodium (ALEVE) 220 MG tablet Take 220 mg by mouth 2 (two) times daily as needed. For pain    [provider]  Pseudoeph-Doxylamine-DM-APAP (NYQUIL PO) Take 15 mLs by mouth every 6 (six) hours as needed. For flu symptoms.    [provider]      Allergies    Penicillins    Review of Systems   Review of Systems  Constitutional:  Negative for fever.  Respiratory:  Positive for shortness of breath.   Cardiovascular:  Positive for chest pain.    Physical  Exam Updated Vital Signs BP 137/76   Pulse 76   Temp 98.4 F (36.9 C)   Resp 18   SpO2 100%  Physical Exam Vitals and nursing note reviewed.  Constitutional:      General: She is not in acute distress.    Appearance: She is well-developed.  HENT:     Head: Normocephalic and atraumatic.     Right Ear: External ear normal.     Left Ear: External ear normal.  Eyes:     General: No scleral icterus.       Right eye: No discharge.        Left eye: No discharge.     Conjunctiva/sclera: Conjunctivae normal.  Neck:     Trachea: No tracheal deviation.  Cardiovascular:     Rate and Rhythm: Normal rate and regular rhythm.  Pulmonary:     Effort: Pulmonary effort is normal. No respiratory distress.     Breath sounds: Normal breath sounds. No stridor. No wheezing or rales.  Abdominal:     General: Bowel sounds are normal. There is no distension.     Palpations: Abdomen is soft.     Tenderness: There is no abdominal tenderness. There is no guarding or rebound.  Musculoskeletal:        General: No tenderness or deformity.     Cervical back: Neck supple.  Skin:  General: Skin is warm and dry.     Findings: No rash.  Neurological:     General: No focal deficit present.     Mental Status: She is alert.     Cranial Nerves: No cranial nerve deficit (no facial droop, extraocular movements intact, no slurred speech).     Sensory: No sensory deficit.     Motor: No abnormal muscle tone or seizure activity.     Coordination: Coordination normal.  Psychiatric:        Mood and Affect: Mood normal.     ED Results / Procedures / Treatments   Labs (all labs ordered are listed, but only abnormal results are displayed) Labs Reviewed  CBC - Abnormal; Notable for the following components:      Result Value   MCV 79.4 (*)    All other components within normal limits  BASIC METABOLIC PANEL - Abnormal; Notable for the following components:   Glucose, Bld 103 (*)    All other components  within normal limits  TROPONIN I (HIGH SENSITIVITY)  TROPONIN I (HIGH SENSITIVITY)    EKG EKG Interpretation  Date/Time:  Thursday June 09 2022 13:51:56 EDT Ventricular Rate:  83 PR Interval:  168 QRS Duration: 74 QT Interval:  348 QTC Calculation: 408 R Axis:   20 Text Interpretation: Normal sinus rhythm No significant change since last tracing Confirmed by Linwood Dibbles [phone]) on 06/09/2022 10:23:50 PM  Radiology DG Chest 2 View  Result Date: 06/09/2022 CLINICAL DATA:  Chest pain.  Shortness of breath. EXAM: CHEST - 2 VIEW COMPARISON:  None Available. FINDINGS: Cardiac silhouette and mediastinal contours are within normal limits. The lungs are clear. No pleural effusion or pneumothorax. No acute skeletal abnormality. Cholecystectomy clips. IMPRESSION: No active cardiopulmonary disease. Electronically Signed   By: Neita Garnet M.D.   On: 06/09/2022 14:33    Procedures Procedures    Medications Ordered in ED Medications  amLODipine (NORVASC) tablet 5 mg (has no administration in time range)    ED Course/ Medical Decision Making/ A&P Clinical Course as of 06/09/22 2224  Thu Jun 09, 2022  1941 Troponin I (High Sensitivity) Normal [JK]  1941 CBC(!) nl [JK]  1941 Basic metabolic panel(!) nl [JK]    Clinical Course User Index [JK] Linwood Dibbles, MD           HEART Score: 2                Medical Decision Making Patient presents with complaints of chest pain differential diagnosis included but not limited to acute coronary syndrome, pneumonia, pneumothorax, hypertensive emergency  Problems Addressed: Chest pain, unspecified type: acute illness or injury that poses a threat to life or bodily functions Hypertension, unspecified type: chronic illness or injury  Amount and/or Complexity of Data Reviewed External Data Reviewed: notes.    Details: Outpatient records reviewed.  Blood pressure elevated 2 outpatient visits in Jan and May of this year Labs: ordered. Decision-making  details documented in ED Course. Radiology: ordered and independent interpretation performed.  Risk Prescription drug management.   Patient presented with complaints of chest pain and shortness of breath.  Low risk for PE, PERC negative, doubt PE.  Serial troponins normal.  Low risk heart score, doubt acute coronary syndrome  Patient noted to be hypertensive in the ED.  I did review outpatient records and she was noted to have elevated blood pressure in the past.  Possible her symptoms may be related to her blood pressure.  We will start  her on Norvasc.  Discussed outpatient follow-up with PCP        Final Clinical Impression(s) / ED Diagnoses Final diagnoses:  Hypertension, unspecified type  Chest pain, unspecified type    Rx / DC Orders ED Discharge Orders          Ordered    amLODipine (NORVASC) 5 MG tablet  Daily        06/09/22 2221              Dorie Rank, MD 06/09/22 2224
